# Patient Record
Sex: Female | Born: 1960 | Race: White | Hispanic: No | Marital: Single | Smoking: Former smoker
Health system: Southern US, Community
[De-identification: ages and names within clinical notes are randomized; demographics above are authoritative.]

## PROBLEM LIST (undated history)

## (undated) DIAGNOSIS — K219 Gastro-esophageal reflux disease without esophagitis: Secondary | ICD-10-CM

## (undated) DIAGNOSIS — I10 Essential (primary) hypertension: Secondary | ICD-10-CM

## (undated) DIAGNOSIS — M1990 Unspecified osteoarthritis, unspecified site: Secondary | ICD-10-CM

## (undated) DIAGNOSIS — E785 Hyperlipidemia, unspecified: Secondary | ICD-10-CM

## (undated) HISTORY — DX: Essential (primary) hypertension: I10

## (undated) HISTORY — DX: Gastro-esophageal reflux disease without esophagitis: K21.9

## (undated) HISTORY — PX: TOTAL HIP ARTHROPLASTY: SHX124

## (undated) HISTORY — PX: WISDOM TOOTH EXTRACTION: SHX21

## (undated) HISTORY — DX: Unspecified osteoarthritis, unspecified site: M19.90

## (undated) HISTORY — DX: Hyperlipidemia, unspecified: E78.5

---
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
## 1999-05-10 HISTORY — PX: CHOLECYSTECTOMY: SHX55

## 2003-01-01 ENCOUNTER — Other Ambulatory Visit: Admission: RE | Admit: 2003-01-01 | Discharge: 2003-01-01 | Payer: Self-pay | Admitting: Obstetrics and Gynecology

## 2004-02-03 ENCOUNTER — Encounter: Admission: RE | Admit: 2004-02-03 | Discharge: 2004-02-03 | Payer: Self-pay | Admitting: Obstetrics and Gynecology

## 2004-02-12 ENCOUNTER — Encounter: Admission: RE | Admit: 2004-02-12 | Discharge: 2004-02-12 | Payer: Self-pay | Admitting: Obstetrics and Gynecology

## 2010-05-30 ENCOUNTER — Encounter: Payer: Self-pay | Admitting: *Deleted

## 2011-07-06 ENCOUNTER — Ambulatory Visit (INDEPENDENT_AMBULATORY_CARE_PROVIDER_SITE_OTHER): Payer: 59 | Admitting: General Surgery

## 2011-07-18 ENCOUNTER — Ambulatory Visit (INDEPENDENT_AMBULATORY_CARE_PROVIDER_SITE_OTHER): Payer: 59 | Admitting: General Surgery

## 2011-07-18 ENCOUNTER — Encounter (INDEPENDENT_AMBULATORY_CARE_PROVIDER_SITE_OTHER): Payer: Self-pay | Admitting: General Surgery

## 2011-07-18 NOTE — Progress Notes (Signed)
Patient ID: Carolyn Morrison, female   DOB: [birth year], 51 y.o.   MRN: [id number]  Chief Complaint  Patient presents with  . Other    new bariatric- gastric sleeve    HPI Carolyn Morrison is a 51 y.o. female. This patient presents for evaluation for possible weight loss surgery. She has a BMI of 61 with comorbidities of arthritis, hypertension, and reflux. She struggled with her weight since she was age 79. She has tried multiple diets including Weight Watchers, Fen/Phen and weight loss clinic diet with the best results in the Fen/Phen diet. She lost 80 pounds in 6 months when on this. 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 She states that she'll have reflux even with water if she does not take her Nexium. HPI  Past Medical History  Diagnosis Date  . GERD (gastroesophageal reflux disease)   . Arthritis   . Hypertension   . Hyperlipidemia     Past Surgical History  Procedure Date  . Cesarean section 1992  . Cholecystectomy 2001  . Facial reconstruction surgery 1978    Family History  Problem Relation Age of Onset  . Diabetes Maternal Aunt     Social History History  Substance Use Topics  . Smoking status: Current Everyday Smoker -- 0.2 packs/day  . Smokeless tobacco: Not on file  . Alcohol Use: Yes    No Known Allergies  Current Outpatient Prescriptions  Medication Sig Dispense Refill  . aspirin 81 MG tablet Take 81 mg by mouth daily.      . CELEBREX 200 MG capsule daily.      . CYMBALTA 60 MG capsule daily.      . metaxalone (SKELAXIN) 800 MG tablet Ad lib.      Marland Kitchen MICARDIS HCT 80-12.5 MG per tablet daily.      Marland Kitchen NEXIUM 40 MG capsule daily.      . traMADol (ULTRAM) 50 MG tablet Ad lib.      Marland Kitchen ZOVIRAX 5 % Ad lib.        Review of Systems Review of Systems All other review of systems  negative or noncontributory except as stated in the HPI  Blood pressure 146/104, pulse 104, temperature 97.8 F (36.6 C), temperature source Temporal, resp. rate 24, height 5' 6.5" (1.689 m), weight 387 lb 3.2 oz (175.633 kg).  Physical Exam Physical Exam Physical Exam  Nursing note and vitals reviewed. Constitutional: She is oriented to person, place, and time. She appears well-developed and well-nourished. No distress.  HENT:  Head: Normocephalic and atraumatic.  Mouth/Throat: No oropharyngeal exudate.  Eyes: Conjunctivae and EOM are normal. Pupils are equal, round, and reactive to light. Right eye exhibits no discharge. Left eye exhibits no discharge. No scleral icterus.  Neck: Normal range of motion. Neck supple. No tracheal deviation present.  Cardiovascular: Normal rate, regular rhythm, normal heart sounds and intact distal pulses.   Pulmonary/Chest: Effort normal and breath sounds normal. No stridor. No respiratory distress. She has no wheezes.  Abdominal: Soft. Bowel sounds are normal. She exhibits no distension and no mass. There is no tenderness. There is no rebound and no guarding.  Musculoskeletal: Normal range of motion. She exhibits no edema and no tenderness.  Neurological: She is alert and oriented to person, place, and time.  Skin: Skin is warm  and dry. No rash noted. She is not diaphoretic. No erythema. No pallor.  Psychiatric: She has a normal mood and affect. Her behavior is normal. Judgment and thought content normal.    Data Reviewed   Assessment    Morbid obesity with a BMI of 61 and comorbidities of arthritis, GERD, and hypertension. I think she be a fine candidate for any procedures although given her severe reflux I think that  the. sleeve gastrectomy would not be advisable for her. She is not interested in the bed due to the maintenance and since this does not have as good a weight loss as the other procedures. We discussed the pros and cons of all the  procedures including the risks and benefits of each and she is most interested in the Roux-en-Y gastric bypass at this time. We discussed the risks of too much or too little weight loss, we regain, vitamin deficiencies, internal hernias, ulcers, and anastomotic leaks and she expressed understanding of the tube proceed with workup for weight loss surgery    Plan    We will start her on the surgery workup including nutrition and psychology evaluation. She will also need an echocardiogram given her history of Fen/Phen use. We will set her up with her mammograms and nutrition labs and we will see her back after her initial workup.  In the meantime, I have recommended continued weight loss.       Lodema Pilot DAVID 07/18/2011, 4:12 PM

## 2011-07-19 ENCOUNTER — Other Ambulatory Visit (INDEPENDENT_AMBULATORY_CARE_PROVIDER_SITE_OTHER): Payer: Self-pay

## 2011-07-19 DIAGNOSIS — Z1231 Encounter for screening mammogram for malignant neoplasm of breast: Secondary | ICD-10-CM

## 2011-07-19 LAB — CBC WITH DIFFERENTIAL/PLATELET
Basophils Absolute: 0 10*3/uL (ref 0.0–0.1)
Basophils Relative: 0 % (ref 0–1)
Eosinophils Absolute: 0.3 10*3/uL (ref 0.0–0.7)
Eosinophils Relative: 4 % (ref 0–5)
HCT: 42.2 % (ref 36.0–46.0)
Hemoglobin: 13.7 g/dL (ref 12.0–15.0)
Lymphocytes Relative: 28 % (ref 12–46)
Lymphs Abs: 2.7 10*3/uL (ref 0.7–4.0)
MCH: 29.5 pg (ref 26.0–34.0)
MCHC: 32.5 g/dL (ref 30.0–36.0)
MCV: 90.8 fL (ref 78.0–100.0)
Monocytes Absolute: 0.6 10*3/uL (ref 0.1–1.0)
Monocytes Relative: 6 % (ref 3–12)
Neutro Abs: 6.1 10*3/uL (ref 1.7–7.7)
Neutrophils Relative %: 62 % (ref 43–77)
Platelets: 279 10*3/uL (ref 150–400)
RBC: 4.65 MIL/uL (ref 3.87–5.11)
RDW: 14.9 % (ref 11.5–15.5)
WBC: 9.8 10*3/uL (ref 4.0–10.5)

## 2011-07-19 LAB — COMPREHENSIVE METABOLIC PANEL
ALT: 12 U/L (ref 0–35)
AST: 13 U/L (ref 0–37)
Albumin: 4 g/dL (ref 3.5–5.2)
Alkaline Phosphatase: 96 U/L (ref 39–117)
BUN: 13 mg/dL (ref 6–23)
CO2: 28 mEq/L (ref 19–32)
Calcium: 9.5 mg/dL (ref 8.4–10.5)
Chloride: 103 mEq/L (ref 96–112)
Creat: 0.66 mg/dL (ref 0.50–1.10)
Glucose, Bld: 89 mg/dL (ref 70–99)
Potassium: 4.2 mEq/L (ref 3.5–5.3)
Sodium: 139 mEq/L (ref 135–145)
Total Bilirubin: 0.4 mg/dL (ref 0.3–1.2)
Total Protein: 6.9 g/dL (ref 6.0–8.3)

## 2011-07-19 LAB — LIPID PANEL
Cholesterol: 212 mg/dL — ABNORMAL HIGH (ref 0–200)
HDL: 38 mg/dL — ABNORMAL LOW (ref >39)
LDL Cholesterol: 136 mg/dL — ABNORMAL HIGH (ref 0–99)
Total CHOL/HDL Ratio: 5.6 ratio
Triglycerides: 188 mg/dL — ABNORMAL HIGH (ref <150)
VLDL: 38 mg/dL (ref 0–40)

## 2011-07-19 LAB — IRON AND TIBC
%SAT: 17 % — ABNORMAL LOW (ref 20–55)
Iron: 58 ug/dL (ref 42–145)
TIBC: 332 ug/dL (ref 250–470)
UIBC: 274 ug/dL (ref 125–400)

## 2011-07-19 LAB — TSH: TSH: 1.286 u[IU]/mL (ref 0.350–4.500)

## 2011-07-19 LAB — T4: T4, Total: 8.7 ug/dL (ref 5.0–12.5)

## 2011-07-19 LAB — H. PYLORI ANTIBODY, IGG: H Pylori IgG: <0.4 {ISR}

## 2011-07-26 ENCOUNTER — Ambulatory Visit (HOSPITAL_COMMUNITY)
Admission: RE | Admit: 2011-07-26 | Discharge: 2011-07-26 | Disposition: A | Discharge status: Home / Self Care | Payer: 59 | Source: Ambulatory Visit | Attending: General Surgery | Admitting: General Surgery

## 2011-07-26 ENCOUNTER — Other Ambulatory Visit (INDEPENDENT_AMBULATORY_CARE_PROVIDER_SITE_OTHER): Payer: Self-pay | Admitting: General Surgery

## 2011-07-26 ENCOUNTER — Other Ambulatory Visit (HOSPITAL_COMMUNITY): Payer: Self-pay

## 2011-07-26 DIAGNOSIS — Z01818 Encounter for other preprocedural examination: Secondary | ICD-10-CM | POA: Insufficient documentation

## 2011-08-09 ENCOUNTER — Encounter: Payer: 59 | Attending: General Surgery | Admitting: *Deleted

## 2011-08-09 ENCOUNTER — Ambulatory Visit (HOSPITAL_COMMUNITY)
Admission: RE | Admit: 2011-08-09 | Discharge: 2011-08-09 | Payer: 59 | Source: Ambulatory Visit | Attending: General Surgery | Admitting: General Surgery

## 2011-08-09 ENCOUNTER — Encounter: Payer: Self-pay | Admitting: *Deleted

## 2011-08-09 DIAGNOSIS — Z713 Dietary counseling and surveillance: Secondary | ICD-10-CM | POA: Insufficient documentation

## 2011-08-09 DIAGNOSIS — Z01818 Encounter for other preprocedural examination: Secondary | ICD-10-CM | POA: Insufficient documentation

## 2011-08-09 NOTE — Patient Instructions (Signed)
   Follow Pre-Op Nutrition Goals to prepare for Gastric Bypass Surgery.   Call the [facility] at [phone] once you have been given your surgery date to enrolled in the Pre-Op Nutrition Class. You will need to attend this nutrition class 3-4 weeks prior to your surgery. 

## 2011-08-09 NOTE — Progress Notes (Addendum)
  Pre-Op Assessment Visit:  Pre-Operative Gastric Bypass Surgery  Medical Nutrition Therapy:  Appt start time: 1200 end time:  1300.  Patient was seen on 08/09/2011 for Pre-Operative Gastric Bypass Nutrition Assessment. Assessment and letter of approval faxed to [facility] Bariat[facility] Program coordinator on 08/09/2011.  Approval letter sent to [facility] and will be available in the chart under the media tab.  *Pt did not want to do Tanita measurements this visit.   Handouts given during visit include:  Pre-Op Goals Handout  Bariatric Protein Shakes handout  Bariatric Support Group dates  B.E.L.T. Program Flyer  Patient to call for Pre-Op and Post-Op Nutrition Education at the [facility] when surgery is scheduled.

## 2011-09-15 ENCOUNTER — Ambulatory Visit: Payer: 59

## 2011-09-21 ENCOUNTER — Other Ambulatory Visit (INDEPENDENT_AMBULATORY_CARE_PROVIDER_SITE_OTHER): Payer: Self-pay | Admitting: General Surgery

## 2011-09-22 ENCOUNTER — Other Ambulatory Visit (INDEPENDENT_AMBULATORY_CARE_PROVIDER_SITE_OTHER): Payer: Self-pay

## 2011-09-22 DIAGNOSIS — Z01812 Encounter for preprocedural laboratory examination: Secondary | ICD-10-CM

## 2011-09-22 DIAGNOSIS — Z720 Tobacco use: Secondary | ICD-10-CM

## 2011-10-07 ENCOUNTER — Other Ambulatory Visit (INDEPENDENT_AMBULATORY_CARE_PROVIDER_SITE_OTHER): Payer: Self-pay | Admitting: General Surgery

## 2011-10-07 ENCOUNTER — Ambulatory Visit
Admission: RE | Admit: 2011-10-07 | Discharge: 2011-10-07 | Disposition: A | Discharge status: Home / Self Care | Payer: 59 | Source: Ambulatory Visit | Attending: General Surgery | Admitting: General Surgery

## 2011-10-07 DIAGNOSIS — Z1231 Encounter for screening mammogram for malignant neoplasm of breast: Secondary | ICD-10-CM

## 2011-10-10 LAB — NICOTINE/COTININE METABOLITES: Cotinine: 70 ng/mL

## 2011-10-20 ENCOUNTER — Telehealth (INDEPENDENT_AMBULATORY_CARE_PROVIDER_SITE_OTHER): Payer: Self-pay

## 2011-10-20 ENCOUNTER — Other Ambulatory Visit (INDEPENDENT_AMBULATORY_CARE_PROVIDER_SITE_OTHER): Payer: Self-pay

## 2011-10-20 DIAGNOSIS — Z01812 Encounter for preprocedural laboratory examination: Secondary | ICD-10-CM

## 2011-10-20 NOTE — Telephone Encounter (Signed)
Paged Dr. Biagio Quint regarding patient Pre-Op Nicotine Test results of 70, range for negative results are below 25.  Per Dr. Biagio Quint Carolyn Morrison must have negative nicotine test reults before Gastric By-Pass/RNY surgery will be scheduled.  Re-Test in 30 day's (11/07/2011)  Orders will be sent to the patient.

## 2011-11-09 ENCOUNTER — Other Ambulatory Visit (INDEPENDENT_AMBULATORY_CARE_PROVIDER_SITE_OTHER): Payer: Self-pay | Admitting: General Surgery

## 2011-11-10 LAB — NICOTINE/COTININE METABOLITES: Cotinine: <10 ng/mL

## 2011-11-23 ENCOUNTER — Other Ambulatory Visit (INDEPENDENT_AMBULATORY_CARE_PROVIDER_SITE_OTHER): Payer: Self-pay | Admitting: General Surgery

## 2011-11-23 ENCOUNTER — Telehealth (INDEPENDENT_AMBULATORY_CARE_PROVIDER_SITE_OTHER): Payer: Self-pay

## 2011-11-23 NOTE — Telephone Encounter (Signed)
Spoke with patient, Dr. Biagio Quint has patient's chart plans to review all pre-op bariatric test results including the nicotine test and we will then schedule her for surgery.

## 2011-12-08 ENCOUNTER — Encounter: Payer: 59 | Attending: General Surgery | Admitting: *Deleted

## 2011-12-08 ENCOUNTER — Encounter: Payer: Self-pay | Admitting: *Deleted

## 2011-12-08 DIAGNOSIS — Z713 Dietary counseling and surveillance: Secondary | ICD-10-CM | POA: Insufficient documentation

## 2011-12-08 DIAGNOSIS — Z01818 Encounter for other preprocedural examination: Secondary | ICD-10-CM | POA: Insufficient documentation

## 2011-12-08 NOTE — Progress Notes (Addendum)
  Bariatric Class:  Appt start time: 1730 end time:  1830.  Pre-Operative Nutrition Class  Patient was seen on 12/08/2011 for Pre-Operative Bariatric Surgery Education at the [facility].   Surgery date: 01/03/12 Surgery type: RYGB Start weight at [facility]: 382.5 lbs  Weight today: 392.1 lbs Weight change: n/a Total weight lost: n/a BMI: 63.3 kg/m^2  Samples given per MNT protocol: Bariatric Advantage Multivitamin Lot # [id number] Exp: 12/13  Bariatric Advantage Calcium Citrate Lot # [id number] Exp: 12/13  Celebrate Vitamins Multivitamin Lot # [id number] Exp: 11/14  Celebrate Vitamins Calcium Citrate Lot # [id number] Exp: 10/14  Opurity Vitamins MVI (Sleeve/Bypass Optimized) Lot # [id number] Exp: 11/14  Twin[employer] Calcium Wafers Lot # [id number] Exp: 10/14  The following the learning objective met by the patient during this course:   Identifies Pre-Op Dietary Goals and will begin 2 weeks pre-operatively  Identifies appropriate sources of fluids and proteins   States protein recommendations and appropriate sources pre and post-operatively  Identifies Post-Operative Dietary Goals and will follow for 2 weeks post-operatively  Identifies appropriate multivitamin and calcium sources  Describes the need for physical activity post-operatively and will follow MD recommendations  States when to call healthcare provider regarding medication questions or post-operative complications  Handouts given during class include:  Pre-Op Bariatric Surgery Diet Handout  Protein Shake Handout  Post-Op Bariatric Surgery Nutrition Handout  BELT Program Information Flyer  Support Group Information Flyer  Follow-Up Plan: Patient will follow-up at [facility] 2 weeks post operatively for diet advancement per MD.

## 2011-12-08 NOTE — Patient Instructions (Signed)
Follow:   Pre-Op Diet per MD 2 weeks prior to surgery  Phase 2- Liquids (clear/full) 2 weeks after surgery  Vitamin/Mineral/Calcium guidelines for purchasing bariatric supplements  Exercise guidelines pre and post-op per MD  Follow-up at [facility] in 2 weeks post-op for diet advancement. Contact Qunisha Bryk as needed with questions/concerns. 

## 2011-12-28 ENCOUNTER — Ambulatory Visit (INDEPENDENT_AMBULATORY_CARE_PROVIDER_SITE_OTHER): Payer: 59 | Admitting: General Surgery

## 2011-12-28 ENCOUNTER — Encounter (HOSPITAL_COMMUNITY): Payer: Self-pay | Admitting: Pharmacy Technician

## 2011-12-28 ENCOUNTER — Other Ambulatory Visit (INDEPENDENT_AMBULATORY_CARE_PROVIDER_SITE_OTHER): Payer: Self-pay | Admitting: General Surgery

## 2011-12-28 ENCOUNTER — Encounter (INDEPENDENT_AMBULATORY_CARE_PROVIDER_SITE_OTHER): Payer: Self-pay | Admitting: General Surgery

## 2011-12-28 VITALS — BP 140/70 | HR 103 | Temp 97.6°F | Ht 66.5 in | Wt 382.0 lb

## 2011-12-28 DIAGNOSIS — Z6841 Body Mass Index (BMI) 40.0 and over, adult: Secondary | ICD-10-CM

## 2011-12-28 LAB — COMPREHENSIVE METABOLIC PANEL
AST: 15 U/L (ref 0–37)
Albumin: 4 g/dL (ref 3.5–5.2)
Alkaline Phosphatase: 83 U/L (ref 39–117)
BUN: 19 mg/dL (ref 6–23)
Potassium: 4 mEq/L (ref 3.5–5.3)
Total Bilirubin: 0.5 mg/dL (ref 0.3–1.2)

## 2011-12-28 LAB — CBC WITH DIFFERENTIAL/PLATELET
Basophils Absolute: 0 10*3/uL (ref 0.0–0.1)
Basophils Relative: 0 % (ref 0–1)
Eosinophils Absolute: 0.3 10*3/uL (ref 0.0–0.7)
HCT: 38.5 % (ref 36.0–46.0)
MCHC: 34 g/dL (ref 30.0–36.0)
Monocytes Absolute: 0.6 10*3/uL (ref 0.1–1.0)
Neutro Abs: 5.6 10*3/uL (ref 1.7–7.7)
Neutrophils Relative %: 63 % (ref 43–77)
RDW: 14.4 % (ref 11.5–15.5)

## 2011-12-28 NOTE — Progress Notes (Signed)
Patient ID: Carolyn Morrison, female   DOB: 1960/09/04, 51 y.o.   MRN: [id number]  Chief Complaint  Patient presents with  . Bariatric Pre-op    RNY    HPI Carolyn Morrison is a 51 y.o. female.  HPI This patient presents for her preoperative evaluation for a laparoscopic Roux-en-Y gastric bypass next week. She's BMI 61 with comorbidities of arthritis, gastroesophageal reflux, hypertension. Since we last minute she has had her upper GI and laboratory studies and seen by nutritionist and psychologist. She has stopped smoking for the last 3 months. She denies any other significant changes since her last visit.  Past Medical History  Diagnosis Date  . GERD (gastroesophageal reflux disease)   . Arthritis   . Hypertension   . Hyperlipidemia     Past Surgical History  Procedure Date  . Cesarean section 1992  . Cholecystectomy 2001  . Facial reconstruction surgery 1978  . Appendectomy 1976    Family History  Problem Relation Age of Onset  . Diabetes Maternal Aunt     Social History History  Substance Use Topics  . Smoking status: Current Everyday Smoker -- 0.2 packs/day  . Smokeless tobacco: Not on file  . Alcohol Use: Yes    No Known Allergies  Current Outpatient Prescriptions  Medication Sig Dispense Refill  . aspirin 81 MG tablet Take 81 mg by mouth daily.      . B Complex Vitamins (VITAMIN-B COMPLEX PO) Take by mouth.      . CELEBREX 200 MG capsule Take 200 mg by mouth every morning.       . Cinnamon 500 MG capsule Take 500 mg by mouth daily.      . CYMBALTA 60 MG capsule Take 60 mg by mouth every morning.       . fish oil-omega-3 fatty acids 1000 MG capsule Take 2 g by mouth daily.      Marland Kitchen glucosamine-chondroitin 500-400 MG tablet Take 1 tablet by mouth 3 (three) times daily.      . magnesium oxide (MAG-OX) 400 MG tablet Take 400 mg by mouth daily.      . metaxalone (SKELAXIN) 800 MG tablet Take 800 mg by mouth 4 (four) times daily.       Marland Kitchen MICARDIS HCT 80-12.5 MG per  tablet Take 1 tablet by mouth every morning.       . Multiple Vitamin (MULTIVITAMIN WITH MINERALS) TABS Take 1 tablet by mouth daily.      Marland Kitchen NEXIUM 40 MG capsule Take 40 mg by mouth every morning.       . traMADol (ULTRAM) 50 MG tablet Take 25 mg by mouth every 6 (six) hours as needed. For pain      . ZOVIRAX 5 % Apply 1 application topically as needed. Apply to affected areas for break outs        Review of Systems Review of Systems All other review of systems negative or noncontributory except as stated in the HPI  Blood pressure 140/70, pulse 103, temperature 97.6 F (36.4 C), temperature source Temporal, height 5' 6.5" (1.689 m), weight 382 lb (173.274 kg), SpO2 98.00%.  Physical Exam Physical Exam Physical Exam  Nursing note and vitals reviewed. Constitutional: She is oriented to person, place, and time. She appears well-developed and well-nourished. No distress.  HENT:  Head: Normocephalic and atraumatic.  Mouth/Throat: No oropharyngeal exudate.  Eyes: Conjunctivae and EOM are normal. Pupils are equal, round, and reactive to light. Right eye exhibits no discharge. Left  eye exhibits no discharge. No scleral icterus.  Neck: Normal range of motion. Neck supple. No tracheal deviation present.  Cardiovascular: Normal rate, regular rhythm, normal heart sounds and intact distal pulses.   Pulmonary/Chest: Effort normal and breath sounds normal. No stridor. No respiratory distress. She has no wheezes.  Abdominal: Soft. Bowel sounds are normal. She exhibits no distension and no mass. There is no tenderness. There is no rebound and no guarding. WHSS Musculoskeletal: Normal range of motion. She exhibits no edema and no tenderness. Walking with cane Neurological: She is alert and oriented to person, place, and time.  Skin: Skin is warm and dry. No rash noted. She is not diaphoretic. No erythema. No pallor.  Psychiatric: She has a normal mood and affect. Her behavior is normal. Judgment and  thought content normal.      Data Reviewed   Assessment    Morbid obesity with a BMI of 61 and comorbidities of hypertension, arthritis, gastroesophageal reflux Think that she is a fine candidate for Roux-en-Y gastric bypass and she should benefit from this. Currently her lifestyle is limited from the arthritis and a think the weight loss will substantially improve this. We would hope to improve her reflux, her arthritis, and hopefully her hypertension is well. We are planning for surgery next week. She has stopped smoking for the last 3 months. We will repeat nicotine test again today with her preop labs.  The risks of infection, bleeding, pain, scarring, weight regain, too little or too much weight loss, vitamin deficiencies and need for lifelong vitamin supplementation, hair loss, need for protein supplementation, leaks, stricture, reflux, food intolerance, need for reoperation , need for open surgery, injury to spleen or surrounding structures, DVT's, PE, and death again discussed with the patient and the patient expressed understanding and desires to proceed with laparoscopic RYGB, possible open, intraoperative endoscopy. I explained that due to her prior procedures, she may be higher risk for open conversion.     Plan    We will plan for RYGB next week.       Lodema Pilot DAVID 12/28/2011, 12:03 PM

## 2011-12-30 NOTE — Patient Instructions (Signed)
20 Carolyn Morrison  12/30/2011   Your procedure is scheduled on:  01/03/12 [id number]  Report to [facility] at 0515 AM.  Call this number if you have problems the morning of surgery: [phone]   Remember:   Do not eat food:After Midnight.  May have clear liquids:until Midnight .    :   Do not wear jewelry, make-up or nail polish.  Do not wear lotions, powders, or perfumes. .  Do not shave 48 hours prior to surgery.   Do not bring valuables to the hospital.  Contacts, dentures or bridgework may not be worn into surgery.  Leave suitcase in the car. After surgery it may be brought to your room.  For patients admitted to the hospital, checkout time is 11:00 AM the day of discharge.     Special Instructions: CHG Shower Use Special Wash: 1/2 bottle night before surgery and 1/2 bottle morning of surgery. shower chin to toes with CHG. Wash face and private parts with regular soap.    Please read over the following fact sheets that you were given: MRSA Information, coughing and deep breathing exercises, leg exercises

## 2012-01-02 ENCOUNTER — Encounter (HOSPITAL_COMMUNITY): Payer: Self-pay

## 2012-01-02 ENCOUNTER — Encounter (HOSPITAL_COMMUNITY)
Admission: RE | Admit: 2012-01-02 | Discharge: 2012-01-02 | Disposition: A | Discharge status: Home / Self Care | Payer: 59 | Source: Ambulatory Visit | Attending: General Surgery | Admitting: General Surgery

## 2012-01-02 ENCOUNTER — Ambulatory Visit (HOSPITAL_COMMUNITY)
Admission: RE | Admit: 2012-01-02 | Discharge: 2012-01-02 | Disposition: A | Discharge status: Home / Self Care | Payer: 59 | Source: Ambulatory Visit | Attending: General Surgery | Admitting: General Surgery

## 2012-01-02 DIAGNOSIS — R0602 Shortness of breath: Secondary | ICD-10-CM | POA: Insufficient documentation

## 2012-01-02 DIAGNOSIS — Z01810 Encounter for preprocedural cardiovascular examination: Secondary | ICD-10-CM | POA: Insufficient documentation

## 2012-01-02 DIAGNOSIS — I10 Essential (primary) hypertension: Secondary | ICD-10-CM | POA: Insufficient documentation

## 2012-01-02 DIAGNOSIS — Z01812 Encounter for preprocedural laboratory examination: Secondary | ICD-10-CM | POA: Insufficient documentation

## 2012-01-02 NOTE — Progress Notes (Signed)
Patient was unsure of bowel prep for today or if there was any.  Patient stated she had no received no instructions from office.  Called office and spoke with Britta Mccreedy.  No special prep per Britta Mccreedy at office per Dr Biagio Quint.  Britta Mccreedy stated patient was better off if did clear liquid diet today.  Patient provided with clear liquid diet sheet and reviewed with patient.  Patient voiced understanding.

## 2012-01-02 NOTE — Progress Notes (Signed)
CBC, DIFF, CMP , and Urine Cotinine done 12/28/11 Results in EPIC.

## 2012-01-03 ENCOUNTER — Encounter (HOSPITAL_COMMUNITY)
Admission: RE | Disposition: A | Discharge status: Home / Self Care | Payer: Self-pay | Source: Ambulatory Visit | Attending: General Surgery

## 2012-01-03 ENCOUNTER — Inpatient Hospital Stay (HOSPITAL_COMMUNITY): Payer: 59 | Admitting: Anesthesiology

## 2012-01-03 ENCOUNTER — Encounter (HOSPITAL_COMMUNITY): Payer: Self-pay | Admitting: Anesthesiology

## 2012-01-03 ENCOUNTER — Inpatient Hospital Stay (HOSPITAL_COMMUNITY)
Admission: RE | Admit: 2012-01-03 | Discharge: 2012-01-05 | DRG: 620 | Disposition: A | Discharge status: Home / Self Care | Payer: 59 | Source: Ambulatory Visit | Attending: General Surgery | Admitting: General Surgery

## 2012-01-03 ENCOUNTER — Encounter (HOSPITAL_COMMUNITY): Payer: Self-pay | Admitting: *Deleted

## 2012-01-03 DIAGNOSIS — K420 Umbilical hernia with obstruction, without gangrene: Secondary | ICD-10-CM | POA: Diagnosis present

## 2012-01-03 DIAGNOSIS — K219 Gastro-esophageal reflux disease without esophagitis: Secondary | ICD-10-CM | POA: Diagnosis present

## 2012-01-03 DIAGNOSIS — Z01812 Encounter for preprocedural laboratory examination: Secondary | ICD-10-CM

## 2012-01-03 DIAGNOSIS — Z6841 Body Mass Index (BMI) 40.0 and over, adult: Secondary | ICD-10-CM

## 2012-01-03 DIAGNOSIS — I10 Essential (primary) hypertension: Secondary | ICD-10-CM | POA: Diagnosis present

## 2012-01-03 DIAGNOSIS — K210 Gastro-esophageal reflux disease with esophagitis: Secondary | ICD-10-CM

## 2012-01-03 DIAGNOSIS — M129 Arthropathy, unspecified: Secondary | ICD-10-CM | POA: Diagnosis present

## 2012-01-03 HISTORY — PX: GASTRIC ROUX-EN-Y: SHX5262

## 2012-01-03 SURGERY — LAPAROSCOPIC ROUX-EN-Y GASTRIC BYPASS WITH UPPER ENDOSCOPY
Anesthesia: General | Site: Throat | Wound class: Clean Contaminated

## 2012-01-03 MED ORDER — BUPIVACAINE HCL (PF) 0.25 % IJ SOLN
INTRAMUSCULAR | Status: AC
  Filled 2012-01-03: qty 30

## 2012-01-03 MED ORDER — OXYCODONE HCL 5 MG/5ML PO SOLN
5.0000 mg | Freq: Once | ORAL | Status: DC | PRN
  Filled 2012-01-03: qty 5

## 2012-01-03 MED ORDER — ROCURONIUM BROMIDE 100 MG/10ML IV SOLN
INTRAVENOUS | Status: DC | PRN
  Administered 2012-01-03: 50 mg via INTRAVENOUS
  Administered 2012-01-03 (×2): 20 mg via INTRAVENOUS
  Administered 2012-01-03: 30 mg via INTRAVENOUS
  Administered 2012-01-03: 20 mg via INTRAVENOUS
  Administered 2012-01-03: 10 mg via INTRAVENOUS
  Administered 2012-01-03: 20 mg via INTRAVENOUS

## 2012-01-03 MED ORDER — BIOTENE DRY MOUTH MT LIQD
15.0000 mL | Freq: Two times a day (BID) | OROMUCOSAL | Status: DC
  Administered 2012-01-03 – 2012-01-05 (×4): 15 mL via OROMUCOSAL

## 2012-01-03 MED ORDER — ACETAMINOPHEN 10 MG/ML IV SOLN: 1000.0000 mg | Freq: Once | INTRAVENOUS | Status: DC | PRN

## 2012-01-03 MED ORDER — HEPARIN SODIUM (PORCINE) 5000 UNIT/ML IJ SOLN
5000.0000 [IU] | Freq: Once | INTRAMUSCULAR | Status: AC
  Administered 2012-01-03: 5000 [IU] via SUBCUTANEOUS
  Filled 2012-01-03: qty 1

## 2012-01-03 MED ORDER — MIDAZOLAM HCL 5 MG/5ML IJ SOLN
INTRAMUSCULAR | Status: DC | PRN
Start: 2012-01-03 — End: 2012-01-03
  Administered 2012-01-03: 2 mg via INTRAVENOUS

## 2012-01-03 MED ORDER — 0.9 % SODIUM CHLORIDE (POUR BTL) OPTIME
TOPICAL | Status: DC | PRN
  Administered 2012-01-03: 1000 mL

## 2012-01-03 MED ORDER — IRBESARTAN 300 MG PO TABS
300.0000 mg | ORAL_TABLET | Freq: Every day | ORAL | Status: DC
  Administered 2012-01-04 – 2012-01-05 (×2): 300 mg via ORAL
  Filled 2012-01-03 (×3): qty 1

## 2012-01-03 MED ORDER — UNJURY VANILLA POWDER
2.0000 [oz_av] | Freq: Four times a day (QID) | ORAL | Status: DC
  Administered 2012-01-05: 2 [oz_av] via ORAL

## 2012-01-03 MED ORDER — HYDROMORPHONE HCL PF 1 MG/ML IJ SOLN
INTRAMUSCULAR | Status: AC
  Filled 2012-01-03: qty 1

## 2012-01-03 MED ORDER — TELMISARTAN-HCTZ 80-12.5 MG PO TABS: 1.0000 | ORAL_TABLET | Freq: Every morning | ORAL | Status: DC

## 2012-01-03 MED ORDER — MORPHINE SULFATE 2 MG/ML IJ SOLN
2.0000 mg | INTRAMUSCULAR | Status: DC | PRN
  Administered 2012-01-03 – 2012-01-04 (×8): 2 mg via INTRAVENOUS
  Filled 2012-01-03 (×8): qty 1

## 2012-01-03 MED ORDER — PROPOFOL 10 MG/ML IV BOLUS
INTRAVENOUS | Status: DC | PRN
  Administered 2012-01-03: 200 mg via INTRAVENOUS

## 2012-01-03 MED ORDER — TISSEEL VH 10 ML EX KIT
PACK | CUTANEOUS | Status: AC
  Filled 2012-01-03: qty 1

## 2012-01-03 MED ORDER — HYDROCHLOROTHIAZIDE 12.5 MG PO CAPS
12.5000 mg | ORAL_CAPSULE | Freq: Every day | ORAL | Status: DC
  Administered 2012-01-04 – 2012-01-05 (×2): 12.5 mg via ORAL
  Filled 2012-01-03 (×3): qty 1

## 2012-01-03 MED ORDER — SODIUM CHLORIDE 0.9 % IV SOLN
INTRAVENOUS | Status: AC
  Filled 2012-01-03: qty 1

## 2012-01-03 MED ORDER — UNJURY CHICKEN SOUP POWDER: 2.0000 [oz_av] | Freq: Four times a day (QID) | ORAL | Status: DC

## 2012-01-03 MED ORDER — ACETAMINOPHEN 10 MG/ML IV SOLN
INTRAVENOUS | Status: DC | PRN
  Administered 2012-01-03: 1000 mg via INTRAVENOUS

## 2012-01-03 MED ORDER — KETAMINE HCL 10 MG/ML IJ SOLN
INTRAMUSCULAR | Status: DC | PRN
  Administered 2012-01-03: 1 mg via INTRAVENOUS
  Administered 2012-01-03 (×12): 2 mg via INTRAVENOUS
  Administered 2012-01-03: 1 mg via INTRAVENOUS
  Administered 2012-01-03: 2 mg via INTRAVENOUS
  Administered 2012-01-03: 25 mg via INTRAVENOUS

## 2012-01-03 MED ORDER — LACTATED RINGERS IR SOLN
Status: DC | PRN
  Administered 2012-01-03: 3000 mL

## 2012-01-03 MED ORDER — ONDANSETRON HCL 4 MG/2ML IJ SOLN: 4.0000 mg | INTRAMUSCULAR | Status: DC | PRN

## 2012-01-03 MED ORDER — SODIUM CHLORIDE 0.9 % IV SOLN
1.0000 g | INTRAVENOUS | Status: AC
  Administered 2012-01-03: 1 g via INTRAVENOUS
  Filled 2012-01-03: qty 1

## 2012-01-03 MED ORDER — SUCCINYLCHOLINE CHLORIDE 20 MG/ML IJ SOLN
INTRAMUSCULAR | Status: DC | PRN
  Administered 2012-01-03: 100 mg via INTRAVENOUS

## 2012-01-03 MED ORDER — KCL IN DEXTROSE-NACL 20-5-0.45 MEQ/L-%-% IV SOLN
INTRAVENOUS | Status: DC
  Administered 2012-01-03: 16:00:00 via INTRAVENOUS
  Administered 2012-01-04: 125 mL/h via INTRAVENOUS
  Administered 2012-01-05: 11:00:00 via INTRAVENOUS
  Filled 2012-01-03 (×9): qty 1000

## 2012-01-03 MED ORDER — UNJURY CHOCOLATE CLASSIC POWDER: 2.0000 [oz_av] | Freq: Four times a day (QID) | ORAL | Status: DC

## 2012-01-03 MED ORDER — BUPIVACAINE HCL 0.25 % IJ SOLN
INTRAMUSCULAR | Status: DC | PRN
  Administered 2012-01-03: 30 mL

## 2012-01-03 MED ORDER — ONDANSETRON HCL 4 MG/2ML IJ SOLN
INTRAMUSCULAR | Status: DC | PRN
  Administered 2012-01-03: 4 mg via INTRAVENOUS

## 2012-01-03 MED ORDER — LIDOCAINE-EPINEPHRINE 1 %-1:100000 IJ SOLN
INTRAMUSCULAR | Status: DC | PRN
  Administered 2012-01-03: 30 mL

## 2012-01-03 MED ORDER — FENTANYL CITRATE 0.05 MG/ML IJ SOLN
INTRAMUSCULAR | Status: DC | PRN
  Administered 2012-01-03: 25 ug via INTRAVENOUS
  Administered 2012-01-03 (×4): 50 ug via INTRAVENOUS
  Administered 2012-01-03: 25 ug via INTRAVENOUS

## 2012-01-03 MED ORDER — LACTATED RINGERS IV SOLN
INTRAVENOUS | Status: DC | PRN
  Administered 2012-01-03 (×4): via INTRAVENOUS

## 2012-01-03 MED ORDER — KETOROLAC TROMETHAMINE 30 MG/ML IJ SOLN
30.0000 mg | Freq: Once | INTRAMUSCULAR | Status: AC
  Administered 2012-01-03: 30 mg via INTRAVENOUS
  Filled 2012-01-03: qty 1

## 2012-01-03 MED ORDER — ENOXAPARIN SODIUM 40 MG/0.4ML ~~LOC~~ SOLN
40.0000 mg | Freq: Every day | SUBCUTANEOUS | Status: DC
  Administered 2012-01-04 – 2012-01-05 (×2): 40 mg via SUBCUTANEOUS
  Filled 2012-01-03 (×2): qty 0.4

## 2012-01-03 MED ORDER — NEOSTIGMINE METHYLSULFATE 1 MG/ML IJ SOLN
INTRAMUSCULAR | Status: DC | PRN
  Administered 2012-01-03: 5 mg via INTRAVENOUS

## 2012-01-03 MED ORDER — MEPERIDINE HCL 50 MG/ML IJ SOLN: 6.2500 mg | INTRAMUSCULAR | Status: DC | PRN

## 2012-01-03 MED ORDER — ACETAMINOPHEN 10 MG/ML IV SOLN
INTRAVENOUS | Status: AC
  Filled 2012-01-03: qty 100

## 2012-01-03 MED ORDER — GLYCOPYRROLATE 0.2 MG/ML IJ SOLN
INTRAMUSCULAR | Status: DC | PRN
  Administered 2012-01-03: 0.6 mg via INTRAVENOUS

## 2012-01-03 MED ORDER — HYDROMORPHONE HCL PF 1 MG/ML IJ SOLN
0.2500 mg | INTRAMUSCULAR | Status: DC | PRN
  Administered 2012-01-03 (×2): 0.5 mg via INTRAVENOUS

## 2012-01-03 MED ORDER — OXYCODONE-ACETAMINOPHEN 5-325 MG/5ML PO SOLN
5.0000 mL | ORAL | Status: DC | PRN
  Administered 2012-01-05 (×2): 10 mL via ORAL
  Filled 2012-01-03 (×2): qty 10

## 2012-01-03 MED ORDER — LIDOCAINE-EPINEPHRINE 1 %-1:100000 IJ SOLN
INTRAMUSCULAR | Status: AC
  Filled 2012-01-03: qty 1

## 2012-01-03 MED ORDER — OXYCODONE HCL 5 MG PO TABS: 5.0000 mg | ORAL_TABLET | Freq: Once | ORAL | Status: DC | PRN

## 2012-01-03 MED ORDER — METOCLOPRAMIDE HCL 5 MG/ML IJ SOLN
INTRAMUSCULAR | Status: DC | PRN
  Administered 2012-01-03: 10 mg via INTRAVENOUS

## 2012-01-03 MED ORDER — PROMETHAZINE HCL 25 MG/ML IJ SOLN: 6.2500 mg | INTRAMUSCULAR | Status: DC | PRN

## 2012-01-03 MED ORDER — DEXAMETHASONE SODIUM PHOSPHATE 4 MG/ML IJ SOLN
INTRAMUSCULAR | Status: DC | PRN
  Administered 2012-01-03: 10 mg via INTRAVENOUS

## 2012-01-03 SURGICAL SUPPLY — 65 items
APPLIER CLIP ROT 13.4 12 LRG (CLIP)
APPLIER CLIP UNV 5X34 EPIX (ENDOMECHANICALS) IMPLANT
BLADE SURG 15 STRL LF DISP TIS (BLADE) ×2 IMPLANT
BLADE SURG 15 STRL SS (BLADE) ×1
CABLE HIGH FREQUENCY MONO STRZ (ELECTRODE) ×3 IMPLANT
CANISTER SUCTION 2500CC (MISCELLANEOUS) ×3 IMPLANT
CHLORAPREP W/TINT 26ML (MISCELLANEOUS) ×6 IMPLANT
CLIP APPLIE ROT 13.4 12 LRG (CLIP) IMPLANT
CLIP SUT LAPRA TY ABSORB (SUTURE) IMPLANT
CLOTH BEACON ORANGE TIMEOUT ST (SAFETY) ×3 IMPLANT
COVER PROBE U/S 5X48 (MISCELLANEOUS) ×3 IMPLANT
COVER SURGICAL LIGHT HANDLE (MISCELLANEOUS) ×3 IMPLANT
DEVICE SUTURE ENDOST 10MM (ENDOMECHANICALS) ×3 IMPLANT
DISSECTOR BLUNT TIP ENDO 5MM (MISCELLANEOUS) IMPLANT
DRAIN CHANNEL 19F RND (DRAIN) ×3 IMPLANT
DRAPE CAMERA CLOSED 9X96 (DRAPES) ×3 IMPLANT
EVACUATOR DRAINAGE 10X20 100CC (DRAIN) ×2 IMPLANT
EVACUATOR SILICONE 100CC (DRAIN) ×1
GAUZE SPONGE 4X4 16PLY XRAY LF (GAUZE/BANDAGES/DRESSINGS) ×3 IMPLANT
GLOVE SURG SS PI 7.5 STRL IVOR (GLOVE) ×6 IMPLANT
GOWN STRL NON-REIN LRG LVL3 (GOWN DISPOSABLE) IMPLANT
GOWN STRL REIN XL XLG (GOWN DISPOSABLE) ×12 IMPLANT
HOVERMATT SINGLE USE (MISCELLANEOUS) ×3 IMPLANT
KIT BASIN OR (CUSTOM PROCEDURE TRAY) ×3 IMPLANT
KIT GASTRIC LAVAGE 34FR ADT (SET/KITS/TRAYS/PACK) ×3 IMPLANT
NEEDLE SPNL 22GX3.5 QUINCKE BK (NEEDLE) ×3 IMPLANT
NS IRRIG 1000ML POUR BTL (IV SOLUTION) ×3 IMPLANT
PACK CARDIOVASCULAR III (CUSTOM PROCEDURE TRAY) ×3 IMPLANT
PEN SKIN MARKING BROAD (MISCELLANEOUS) ×3 IMPLANT
PENCIL BUTTON HOLSTER BLD 10FT (ELECTRODE) ×3 IMPLANT
RELOAD BLUE (STAPLE) ×6 IMPLANT
RELOAD ENDO STITCH (ENDOMECHANICALS) ×18 IMPLANT
RELOAD ENDO STITCH 2.0 (ENDOMECHANICALS)
RELOAD GOLD (STAPLE) ×12 IMPLANT
RELOAD WHITE ECR60W (STAPLE) ×9 IMPLANT
SCISSORS LAP 5X45 EPIX DISP (ENDOMECHANICALS) ×6 IMPLANT
SEALANT SURGICAL APPL DUAL CAN (MISCELLANEOUS) IMPLANT
SET IRRIG TUBING LAPAROSCOPIC (IRRIGATION / IRRIGATOR) ×3 IMPLANT
SHEARS CURVED HARMONIC AC 45CM (MISCELLANEOUS) ×3 IMPLANT
SOLUTION ANTI FOG 6CC (MISCELLANEOUS) ×3 IMPLANT
SPONGE GAUZE 4X4 12PLY (GAUZE/BANDAGES/DRESSINGS) IMPLANT
STAPLE ECHEON FLEX 60 POW ENDO (STAPLE) ×3 IMPLANT
STAPLER CIRC 21 3.5 SULU (STAPLE) ×3 IMPLANT
STAPLER TRANS-ORAL 21MM DST (STAPLE) ×3 IMPLANT
STAPLER VISISTAT 35W (STAPLE) ×3 IMPLANT
STRIP CLOSURE SKIN 1/2X4 (GAUZE/BANDAGES/DRESSINGS) IMPLANT
STRIP PERI DRY VERITAS 60 (STAPLE) ×15 IMPLANT
SUT ETHILON 3 0 PS 1 (SUTURE) ×3 IMPLANT
SUT PROLENE 0 SH 30 (SUTURE) ×6 IMPLANT
SUT RELOAD ENDO STITCH 2 48X1 (ENDOMECHANICALS)
SUT RELOAD ENDO STITCH 2.0 (ENDOMECHANICALS)
SUT VICRYL 0 UR6 27IN ABS (SUTURE) ×3 IMPLANT
SUTURE RELOAD END STTCH 2 48X1 (ENDOMECHANICALS) IMPLANT
SUTURE RELOAD ENDO STITCH 2.0 (ENDOMECHANICALS) IMPLANT
SYR 20CC LL (SYRINGE) ×3 IMPLANT
SYR 50ML LL SCALE MARK (SYRINGE) ×3 IMPLANT
TOWEL OR 17X26 10 PK STRL BLUE (TOWEL DISPOSABLE) ×6 IMPLANT
TRAY FOLEY CATH 14FRSI W/METER (CATHETERS) ×3 IMPLANT
TROCAR BLADELESS 15MM (ENDOMECHANICALS) ×3 IMPLANT
TROCAR XCEL BLADELESS 5X75MML (TROCAR) IMPLANT
TROCAR XCEL NON-BLD 5MMX100MML (ENDOMECHANICALS) ×12 IMPLANT
TROCAR Z-THREAD BLADED 5X100MM (TROCAR) ×3 IMPLANT
TUBING CONNECTING 10 (TUBING) ×3 IMPLANT
TUBING ENDO SMARTCAP (MISCELLANEOUS) ×3 IMPLANT
TUBING FILTER THERMOFLATOR (ELECTROSURGICAL) ×3 IMPLANT

## 2012-01-03 NOTE — Preoperative (Signed)
Beta Blockers   Reason not to administer Beta Blockers:Not Applicable, not on home BB 

## 2012-01-03 NOTE — Anesthesia Preprocedure Evaluation (Addendum)
Anesthesia Evaluation  Patient identified by MRN, date of birth, ID band Patient awake    Reviewed: Allergy & Precautions, H&P , NPO status , Patient's Chart, lab work & pertinent test results  Airway Mallampati: II TM Distance: >3 FB Neck ROM: Full    Dental  (+) Teeth Intact and Dental Advisory Given   Pulmonary neg pulmonary ROS,  breath sounds clear to auscultation  Pulmonary exam normal       Cardiovascular hypertension, Pt. on medications Rhythm:Regular Rate:Normal     Neuro/Psych negative neurological ROS     GI/Hepatic Neg liver ROS, GERD-  Medicated and Controlled,  Endo/Other  Morbid obesity  Renal/GU negative Renal ROS     Musculoskeletal   Abdominal (+) + obese,   Peds  Hematology negative hematology ROS (+)   Anesthesia Other Findings   Reproductive/Obstetrics                          Anesthesia Physical Anesthesia Plan  ASA: III  Anesthesia Plan: General   Post-op Pain Management:    Induction: Intravenous  Airway Management Planned: Oral ETT  Additional Equipment:   Intra-op Plan:   Post-operative Plan: Extubation in OR  Informed Consent: I have reviewed the patients History and Physical, chart, labs and discussed the procedure including the risks, benefits and alternatives for the proposed anesthesia with the patient or authorized representative who has indicated his/her understanding and acceptance.   Dental advisory given  Plan Discussed with: Surgeon and CRNA  Anesthesia Plan Comments:         Anesthesia Quick Evaluation

## 2012-01-03 NOTE — Brief Op Note (Signed)
01/03/2012  11:27 AM  PATIENT:  Carolyn Morrison  51 y.o. female  PRE-OPERATIVE DIAGNOSIS:  morbid obesity   POST-OPERATIVE DIAGNOSIS:  morbid obesity  PROCEDURE:  Procedure(s) (LRB): LAPAROSCOPIC ROUX-EN-Y GASTRIC BYPASS WITH UPPER ENDOSCOPY (N/A) UPPER GI ENDOSCOPY () Umbilical hernia repair  SURGEON:  Surgeon(s) and Role:    * Lodema Pilot, DO - Primary  PHYSICIAN ASSISTANT:   ASSISTANTS: Martin   ANESTHESIA:   general  EBL:  Total I/O In: 3000 [I.V.:3000] Out: 200 [Urine:200]  BLOOD ADMINISTERED:none  DRAINS: Penrose drain in the GJ anastamosis   LOCAL MEDICATIONS USED:  MARCAINE    and LIDOCAINE   SPECIMEN:  No Specimen  DISPOSITION OF SPECIMEN:  N/A  COUNTS:  YES  TOURNIQUET:  * No tourniquets in log *  DICTATION: .Other Dictation: Dictation Number [id number]  PLAN OF CARE: Admit to inpatient   PATIENT DISPOSITION:  PACU - hemodynamically stable.   Delay start of Pharmacological VTE agent (>24hrs) due to surgical blood loss or risk of bleeding: no

## 2012-01-03 NOTE — H&P (View-Only) (Signed)
Patient ID: Carolyn Morrison, female   DOB: 05/04/1961, 51 y.o.   MRN: [id number]  Chief Complaint  Patient presents with  . Bariatric Pre-op    RNY    HPI Carolyn Morrison is a 51 y.o. female.  HPI This patient presents for her preoperative evaluation for a laparoscopic Roux-en-Y gastric bypass next week. She's BMI 61 with comorbidities of arthritis, gastroesophageal reflux, hypertension. Since we last minute she has had her upper GI and laboratory studies and seen by nutritionist and psychologist. She has stopped smoking for the last 3 months. She denies any other significant changes since her last visit.  Past Medical History  Diagnosis Date  . GERD (gastroesophageal reflux disease)   . Arthritis   . Hypertension   . Hyperlipidemia     Past Surgical History  Procedure Date  . Cesarean section 1992  . Cholecystectomy 2001  . Facial reconstruction surgery 1978  . Appendectomy 1976    Family History  Problem Relation Age of Onset  . Diabetes Maternal Aunt     Social History History  Substance Use Topics  . Smoking status: Current Everyday Smoker -- 0.2 packs/day  . Smokeless tobacco: Not on file  . Alcohol Use: Yes    No Known Allergies  Current Outpatient Prescriptions  Medication Sig Dispense Refill  . aspirin 81 MG tablet Take 81 mg by mouth daily.      . B Complex Vitamins (VITAMIN-B COMPLEX PO) Take by mouth.      . CELEBREX 200 MG capsule Take 200 mg by mouth every morning.       . Cinnamon 500 MG capsule Take 500 mg by mouth daily.      . CYMBALTA 60 MG capsule Take 60 mg by mouth every morning.       . fish oil-omega-3 fatty acids 1000 MG capsule Take 2 g by mouth daily.      . glucosamine-chondroitin 500-400 MG tablet Take 1 tablet by mouth 3 (three) times daily.      . magnesium oxide (MAG-OX) 400 MG tablet Take 400 mg by mouth daily.      . metaxalone (SKELAXIN) 800 MG tablet Take 800 mg by mouth 4 (four) times daily.       . MICARDIS HCT 80-12.5 MG per  tablet Take 1 tablet by mouth every morning.       . Multiple Vitamin (MULTIVITAMIN WITH MINERALS) TABS Take 1 tablet by mouth daily.      . NEXIUM 40 MG capsule Take 40 mg by mouth every morning.       . traMADol (ULTRAM) 50 MG tablet Take 25 mg by mouth every 6 (six) hours as needed. For pain      . ZOVIRAX 5 % Apply 1 application topically as needed. Apply to affected areas for break outs        Review of Systems Review of Systems All other review of systems negative or noncontributory except as stated in the HPI  Blood pressure 140/70, pulse 103, temperature 97.6 F (36.4 C), temperature source Temporal, height 5' 6.5" (1.689 m), weight 382 lb (173.274 kg), SpO2 98.00%.  Physical Exam Physical Exam Physical Exam  Nursing note and vitals reviewed. Constitutional: She is oriented to person, place, and time. She appears well-developed and well-nourished. No distress.  HENT:  Head: Normocephalic and atraumatic.  Mouth/Throat: No oropharyngeal exudate.  Eyes: Conjunctivae and EOM are normal. Pupils are equal, round, and reactive to light. Right eye exhibits no discharge. Left   eye exhibits no discharge. No scleral icterus.  Neck: Normal range of motion. Neck supple. No tracheal deviation present.  Cardiovascular: Normal rate, regular rhythm, normal heart sounds and intact distal pulses.   Pulmonary/Chest: Effort normal and breath sounds normal. No stridor. No respiratory distress. She has no wheezes.  Abdominal: Soft. Bowel sounds are normal. She exhibits no distension and no mass. There is no tenderness. There is no rebound and no guarding. WHSS Musculoskeletal: Normal range of motion. She exhibits no edema and no tenderness. Walking with cane Neurological: She is alert and oriented to person, place, and time.  Skin: Skin is warm and dry. No rash noted. She is not diaphoretic. No erythema. No pallor.  Psychiatric: She has a normal mood and affect. Her behavior is normal. Judgment and  thought content normal.      Data Reviewed   Assessment    Morbid obesity with a BMI of 61 and comorbidities of hypertension, arthritis, gastroesophageal reflux Think that she is a fine candidate for Roux-en-Y gastric bypass and she should benefit from this. Currently her lifestyle is limited from the arthritis and a think the weight loss will substantially improve this. We would hope to improve her reflux, her arthritis, and hopefully her hypertension is well. We are planning for surgery next week. She has stopped smoking for the last 3 months. We will repeat nicotine test again today with her preop labs.  The risks of infection, bleeding, pain, scarring, weight regain, too little or too much weight loss, vitamin deficiencies and need for lifelong vitamin supplementation, hair loss, need for protein supplementation, leaks, stricture, reflux, food intolerance, need for reoperation , need for open surgery, injury to spleen or surrounding structures, DVT's, PE, and death again discussed with the patient and the patient expressed understanding and desires to proceed with laparoscopic RYGB, possible open, intraoperative endoscopy. I explained that due to her prior procedures, she may be higher risk for open conversion.     Plan    We will plan for RYGB next week.       [name] DAVID 12/28/2011, 12:03 PM    

## 2012-01-03 NOTE — Anesthesia Procedure Notes (Signed)
Procedure Name: Intubation Date/Time: 01/03/2012 7:30 AM Performed by: Randon Goldsmith CATHERINE PAYNE Pre-anesthesia Checklist: Patient identified, Emergency Drugs available, Suction available and Patient being monitored Patient Re-evaluated:Patient Re-evaluated prior to inductionOxygen Delivery Method: Circle system utilized Preoxygenation: Pre-oxygenation with 100% oxygen Intubation Type: IV induction Ventilation: Mask ventilation without difficulty Laryngoscope Size: Mac and 4 Grade View: Grade I Tube type: Oral Tube size: 7.5 mm Number of attempts: 1 Airway Equipment and Method: Patient positioned with wedge pillow and Stylet Placement Confirmation: ETT inserted through vocal cords under direct vision,  positive ETCO2 and breath sounds checked- equal and bilateral Secured at: 21 cm Tube secured with: Tape Dental Injury: Teeth and Oropharynx as per pre-operative assessment  Comments: Intubation by Roxan Hockey

## 2012-01-03 NOTE — Progress Notes (Signed)
Spoke to Dr. Biagio Quint on phone, informed him that patient has ambulated twice, and is using IS but on continuous pulse ox reading 92-93% on 4l o2. Patient c/o pain to abd, shoulder blades and upper back and was inquiring about a pain pump but I have only been medicating with 2mg  of morphine due to oxygen saturations, MD agreeable. Orders given for toradol and MD transferred into room to speak with patient.

## 2012-01-03 NOTE — Transfer of Care (Signed)
Immediate Anesthesia Transfer of Care Note  Patient: Carolyn Morrison Loss  Procedure(s) Performed: Procedure(s) (LRB): LAPAROSCOPIC ROUX-EN-Y GASTRIC BYPASS WITH UPPER ENDOSCOPY (N/A) UPPER GI ENDOSCOPY ()  Patient Location: PACU  Anesthesia Type: General  Level of Consciousness: awake, alert  and oriented  Airway & Oxygen Therapy: Patient Spontanous Breathing and Patient connected to face mask oxygen  Post-op Assessment: Report given to PACU RN and Post -op Vital signs reviewed and stable  Post vital signs: Reviewed and stable  Complications: No apparent anesthesia complications

## 2012-01-03 NOTE — Interval H&P Note (Signed)
History and Physical Interval Note:  01/03/2012 7:13 AM  Zenovia Jarred Whiters  has presented today for surgery, with the diagnosis of morbid obesity   The various methods of treatment have been discussed with the patient and family. After consideration of risks, benefits and other options for treatment, the patient has consented to  Procedure(s) (LRB): LAPAROSCOPIC ROUX-EN-Y GASTRIC BYPASS WITH UPPER ENDOSCOPY (N/A) as a surgical intervention .  The patient's history has been reviewed, patient examined, no change in status, stable for surgery.  I have reviewed the patient's chart and labs.  Questions were answered to the patient's satisfaction.  Pt. Seen in preop area.  She denies any changes from prior exam.  Nicotine test again negative. The risks of infection, bleeding, pain, scarring, weight regain, too little or too much weight loss, vitamin deficiencies and need for lifelong vitamin supplementation, hair loss, need for protein supplementation, leaks, stricture, reflux, food intolerance, need for reoperation , need for open surgery, injury to spleen or surrounding structures, DVT's, PE, and death again discussed with the patient and the patient expressed understanding and desires to proceed with laparoscopic RYGB, possible open, intraoperative endoscopy.     Lodema Pilot DAVID

## 2012-01-03 NOTE — Anesthesia Postprocedure Evaluation (Signed)
Anesthesia Post Note  Patient: Carolyn Morrison  Procedure(s) Performed: Procedure(s) (LRB): LAPAROSCOPIC ROUX-EN-Y GASTRIC BYPASS WITH UPPER ENDOSCOPY (N/A) UPPER GI ENDOSCOPY ()  Anesthesia type: General  Patient location: PACU  Post pain: Pain level controlled  Post assessment: Post-op Vital signs reviewed  Last Vitals: BP 133/83  Pulse 92  Temp 36.8 C (Oral)  Resp 18  Ht 5\' 6"  (1.676 m)  Wt 375 lb 2 oz (170.156 kg)  BMI 60.55 kg/m2  SpO2 92%  Post vital signs: Reviewed  Level of consciousness: sedated  Complications: No apparent anesthesia complications

## 2012-01-04 ENCOUNTER — Inpatient Hospital Stay (HOSPITAL_COMMUNITY): Payer: 59

## 2012-01-04 ENCOUNTER — Encounter (HOSPITAL_COMMUNITY): Payer: Self-pay | Admitting: General Surgery

## 2012-01-04 LAB — COMPREHENSIVE METABOLIC PANEL
ALT: 65 U/L — ABNORMAL HIGH (ref 0–35)
AST: 61 U/L — ABNORMAL HIGH (ref 0–37)
Albumin: 2.8 g/dL — ABNORMAL LOW (ref 3.5–5.2)
Calcium: 9 mg/dL (ref 8.4–10.5)
GFR calc Af Amer: >90 mL/min (ref >90)
Sodium: 135 mEq/L (ref 135–145)
Total Protein: 6.4 g/dL (ref 6.0–8.3)

## 2012-01-04 LAB — CBC WITH DIFFERENTIAL/PLATELET
Basophils Absolute: 0 10*3/uL (ref 0.0–0.1)
Basophils Relative: 0 % (ref 0–1)
Eosinophils Absolute: 0 10*3/uL (ref 0.0–0.7)
Eosinophils Relative: 0 % (ref 0–5)
MCH: 29.4 pg (ref 26.0–34.0)
MCV: 88.2 fL (ref 78.0–100.0)
Neutrophils Relative %: 74 % (ref 43–77)
Platelets: 263 10*3/uL (ref 150–400)
RDW: 14.2 % (ref 11.5–15.5)
WBC: 9.1 10*3/uL (ref 4.0–10.5)

## 2012-01-04 IMAGING — RF DG UGI W/ GASTROGRAFIN
10 series · 10 of 10 positions shown · non-contrast
Comparison: Preoperative study [DATE].

CLINICAL DATA: Status post gastric bypass surgery.

UPPER GI SERIES WITH KUB
TECHNIQUE: Routine upper GI series was performed with water
soluble contrast.
Fluoroscopy Time: 1.13 minutes

[Series 1: run · 1 of 1 slices shown (1 of 8)]
[im 1/1]
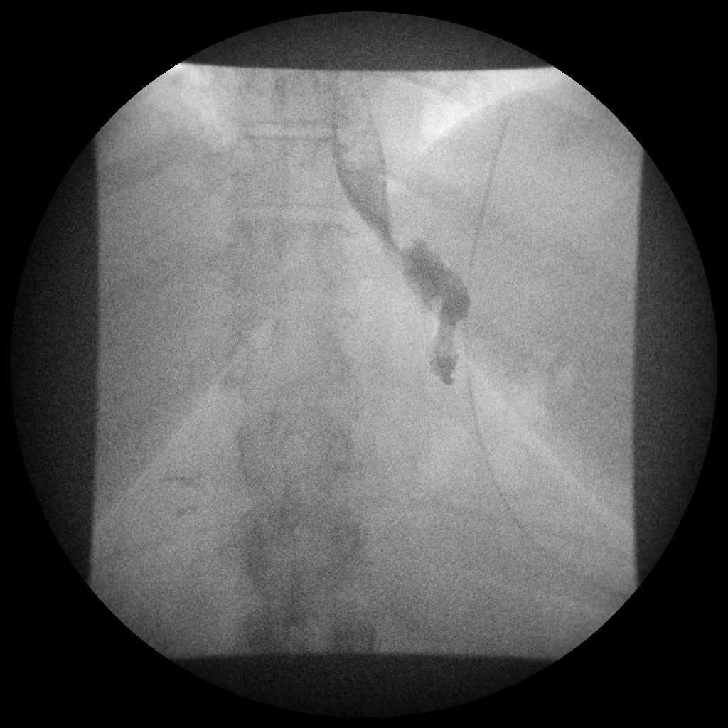

[Series 2: run · 1 of 1 slices shown (2 of 8)]
[im 1/1]
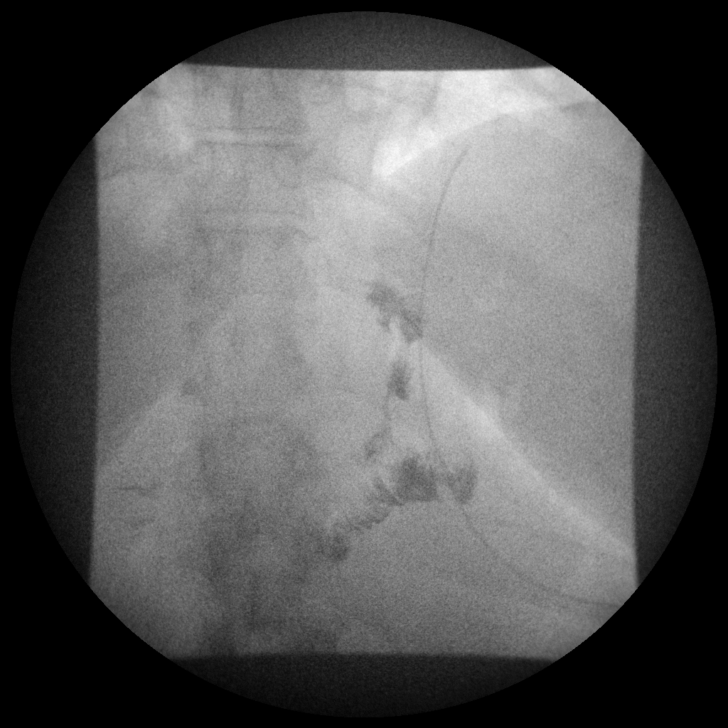

[Series 3: run · 1 of 1 slices shown (3 of 8)]
[im 1/1]
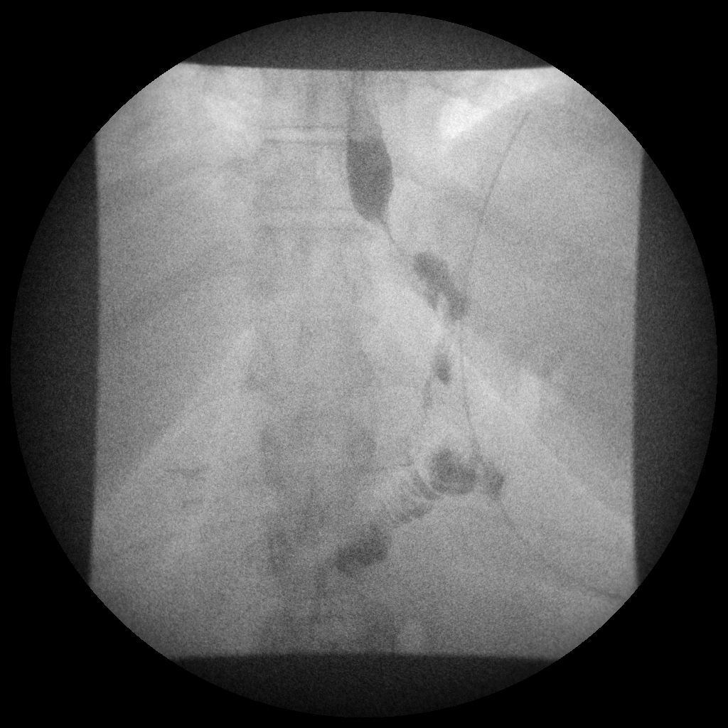

[Series 4: run · 1 of 1 slices shown (4 of 8)]
[im 1/1]
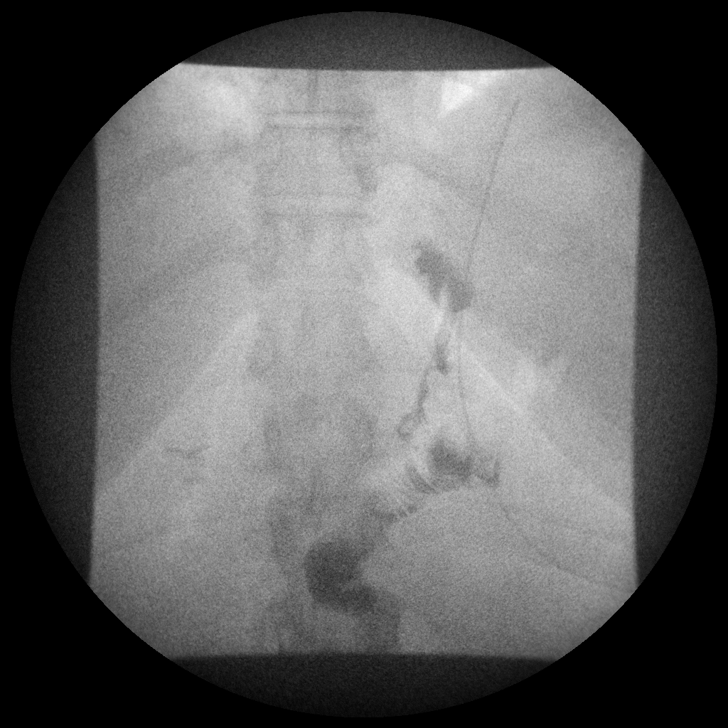

[Series 5: run · 1 of 1 slices shown (5 of 8)]
[im 1/1]
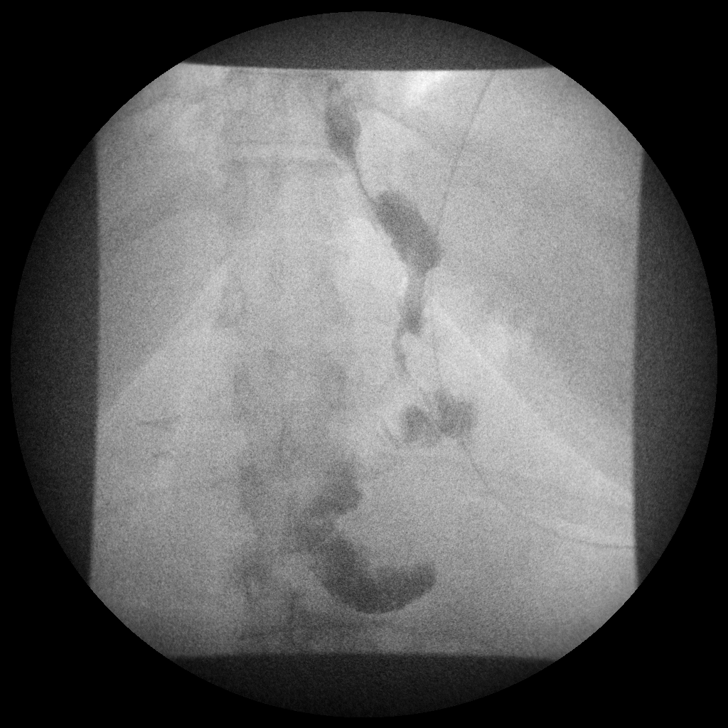

[Series 6: run · 1 of 1 slices shown (6 of 8)]
[im 1/1]
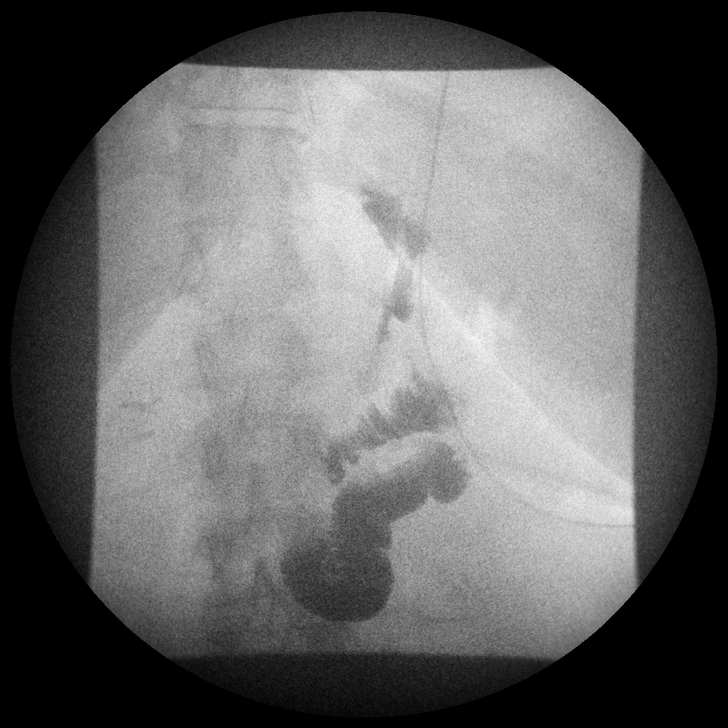

[Series 7: run · 1 of 1 slices shown (7 of 8)]
[im 1/1]
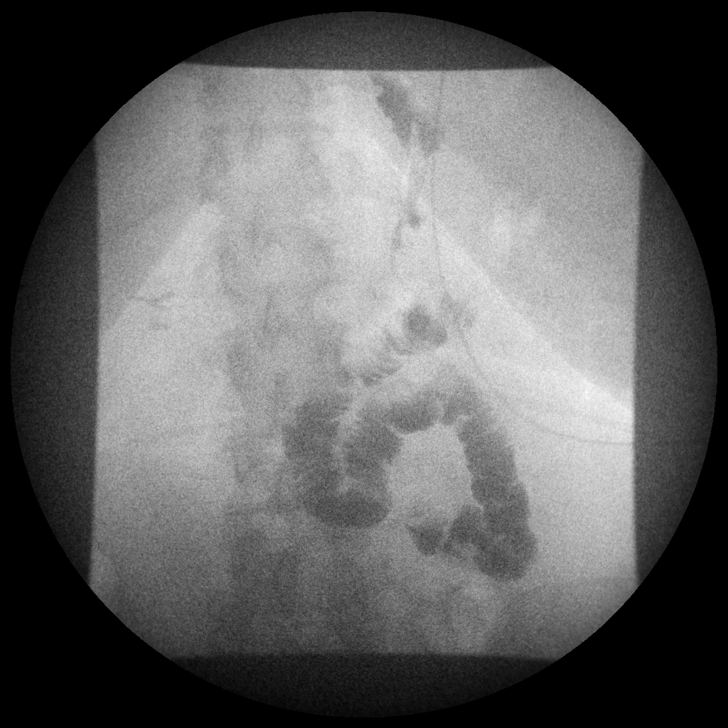

[Series 8: run · 1 of 1 slices shown (8 of 8)]
[im 1/1]
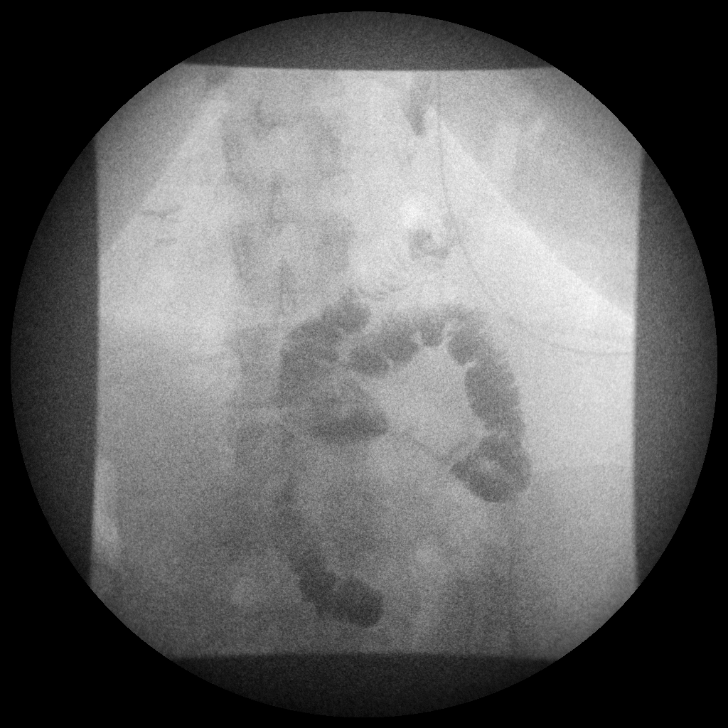

[Series 1001: view not recorded · 0.20mm/px · 1 of 1 slices shown (1 of 2)]
[im 1/1]
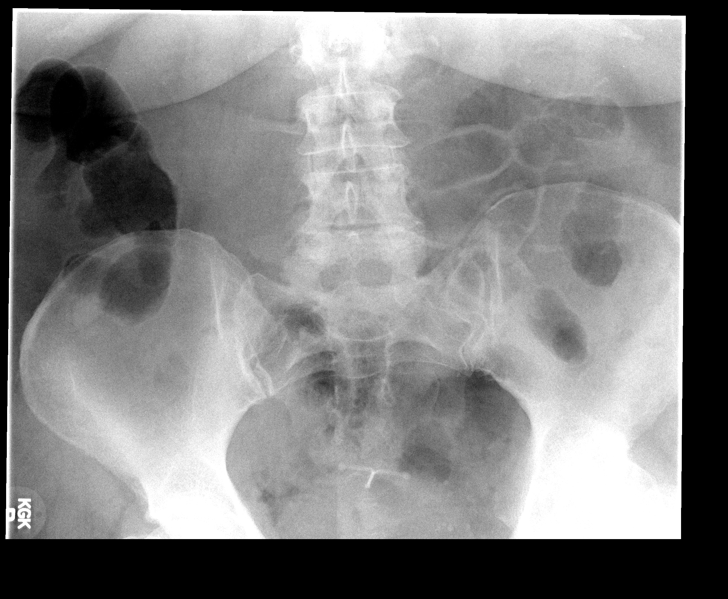

[Series 1002: view not recorded · 0.20mm/px · 1 of 1 slices shown (2 of 2)]
[im 1/1]
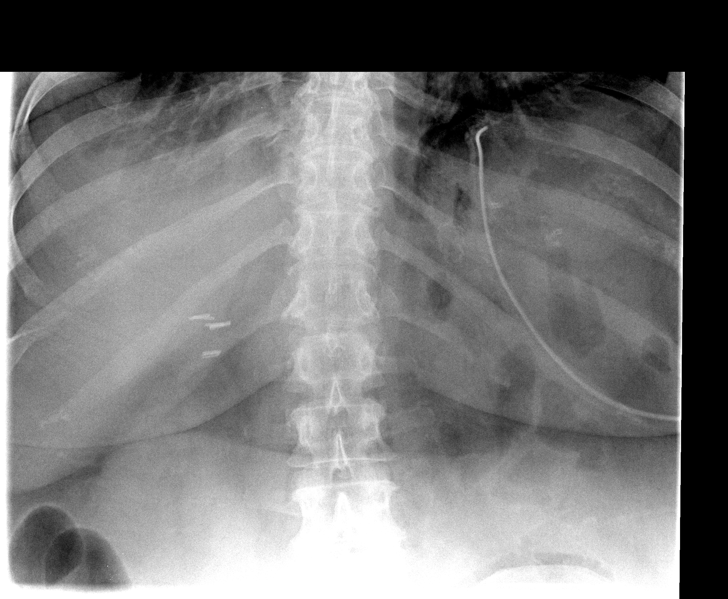

[10 of 10 positions shown; findings below may reference images not displayed]

FINDINGS: The distal esophagus appears normal.  There is a small
residual gastric pouch.  Contrast medium enters the small bowel
through the anastomoses.  No findings for leak/extravasation.  The
small bowel loops are normal.
IMPRESSION: Normal postoperative appearance status post gastric bypass surgery
without complicating features.

## 2012-01-04 MED ORDER — IOHEXOL 300 MG/ML  SOLN: 30.0000 mL | Freq: Once | INTRAMUSCULAR | Status: AC | PRN

## 2012-01-04 MED ORDER — KETOROLAC TROMETHAMINE 30 MG/ML IJ SOLN
30.0000 mg | Freq: Four times a day (QID) | INTRAMUSCULAR | Status: DC | PRN
  Administered 2012-01-04: 30 mg via INTRAVENOUS
  Filled 2012-01-04: qty 1

## 2012-01-04 NOTE — Progress Notes (Signed)
UGI results called to Dr. Biagio Quint; advance to POD #1 diet; Lurena Joiner, RN aware of results and orders. Talmadge Chad, RN Bariatric Nurse Coordinator

## 2012-01-04 NOTE — Progress Notes (Signed)
Still with abdominal pain but better than yesterday.  UGI negative.  Tolerating water.  Will increase quantity of liquids and use oral pain meds.  Continue to mobilize

## 2012-01-04 NOTE — Care Management Note (Signed)
    Page 1 of 1   01/04/2012     11:46:45 AM   CARE MANAGEMENT NOTE 01/04/2012  Patient:  Carolyn Morrison, Carolyn Morrison   Account Number:  [id number]  Date Initiated:  01/04/2012  Documentation initiated by:  Lorenda Ishihara  Subjective/Objective Assessment:   51 yo female admitted s/p lap gastric bypass. PTA lived at home.     Action/Plan:   Anticipated DC Date:  01/05/2012   Anticipated DC Plan:  HOME/SELF CARE      DC Planning Services  CM consult      Choice offered to / List presented to:             Status of service:  Completed, signed off Medicare Important Message given?   (If response is "NO", the following Medicare IM given date fields will be blank) Date Medicare IM given:   Date Additional Medicare IM given:    Discharge Disposition:  HOME/SELF CARE  Per UR Regulation:  Reviewed for med. necessity/level of care/duration of stay  If discussed at Long Length of Stay Meetings, dates discussed:    Comments:

## 2012-01-04 NOTE — Op Note (Signed)
Carolyn Morrison, Carolyn Morrison NO.:  [id number]  MEDICAL RECORD NO.:  [id number]  LOCATION:  1526                         FACILITY:  [facility]  PHYSICIAN:  Lodema Pilot, MD       DATE OF BIRTH:  10-Feb-1961  DATE OF PROCEDURE:  01/03/2012 DATE OF DISCHARGE:                              OPERATIVE REPORT   PROCEDURE:  Laparoscopic Roux-en-Y gastric bypass with intraoperative upper endoscopy and umbilical hernia repair.  PREOPERATIVE DIAGNOSES:  Morbid obesity, arthritis, reflux and hypertension.  POSTOPERATIVE DIAGNOSES:  Morbid obesity, arthritis, reflux, hypertension and umbilical hernia.  SURGEON:  Lodema Pilot, MD  ASSISTANT:  Thornton Park. Daphine Deutscher, MD  ANESTHESIA:  General endotracheal tube anesthesia with 60 mL of 1% lidocaine with epinephrine and 0.25% Marcaine in a 50:50 mixture.  FLUIDS:  3100 mL of crystalloid.  ESTIMATED BLOOD LOSS:  100 mL.  DRAINS:  A 19-French Blake drain was placed near the gastrojejunal anastomosis.  SPECIMENS:  None.  COMPLICATIONS:  None apparent.  FINDINGS:  Moderate-sized umbilical hernia with incarcerated omental fat, which was taken down mostly using blunt dissection.  The Harmonic scalpel was used for this as well.  Primary closure of the umbilical defect with 0 Prolene sutures.  The circular stapled the gastrojejunal anastomosis with a 21-mm EEA stapler and a linear stapled side-to-side functional anastomosis for the jejunostomy.  INDICATION FOR PROCEDURE:  Carolyn Morrison is a 51 year old female with BMI of 63 with comorbidities of arthritis, gastroesophageal reflux disease, hypertension, who failed medical weight loss attempts and needed a durable weight loss solution.  She was previously a smoker and she had been smoke-free since June.  Nicotine test was performed preoperatively was negative to confirm smoking cessation.  OPERATIVE DETAILS:  The patient was seen and evaluated in the preoperative area and risks and  benefits of procedure were again discussed in lay terms.  Informed consent was obtained.  Subcu heparin was administered and prophylactic antibiotics was given as well.  She was taken to the operating room and placed on the table in supine position.  General endotracheal tube anesthesia was obtained and Foley catheter was placed.  Her abdomen was prepped and draped in a standard surgical fashion and a 5-mm Optiview trocar was used to access the peritoneum in the left upper quadrant.  Pneumoperitoneum was obtained and the laparoscope was introduced.  There was no evidence of bowel injury upon entry.  A 5-mm left rectus port was placed, a 5-mm right upper quadrant port and 15-mm right rectus port was placed, all under direct visualization.  Additional 5-mm port was placed laterally for second assistant working port.  She had some incarcerated omentum in the umbilical hernia defect.  Most of this was reduced with blunt dissection.  The Harmonic scalpel was also used to take down some omental adhesions in this area.  There was no evidence of bowel adhered to the abdominal wall.  With the omentum mobilized, I rolled the omentum up over the liver and elevated the transverse colon.  The ligament of Treitz was identified and I measured 50 cm from the ligament of Treitz and divided the small bowel with a 60 mm, 2.5 mm staple load.  I divided  the mesentery down to its base using Harmonic scalpel, taking care to avoid undermining each limb of the intestine and a suture was placed on the Roux limb for identification.  Then, I measured out 100 cm and enterotomy was made on the antimesenteric side of the bowel and both the BP limb and the Roux limb, and side-to-side stapled anastomosis was created with a 60-mm white load stapler.  Three sutures were placed through the common enterotomy and the sutures were elevated and a blue load was fired under the sutures to close the common enterotomy transversely  across the bowel.  The staples appeared well formed and the staple line was hemostatic.  However, there was a burn mark from the Harmonic scalpel, making the enterotomy on the Roux limb side of the anastomosis.  So, in this area, I concern that it was not completely closed.  I placed some simple sutures and some Lembert sutures using the 2-0 Surgidac suture to ensure adequate closure of the common enterotomy. A crotch stitch was placed as well and alignment stitch was placed as well, climbing the BP limb with the Roux limb, and the JJ defect was closed with a running 2-0 Surgidac suture.  The anastomosis appeared adequate and Tisseel fibrin glue was placed over the staple line.  The omentum was divided up to the colon and Carolyn Morrison liver retractor was placed through the epigastrium through a separate stab incision.  I retracted the left lobe of the liver, which was enlarged and fatty.  The pars flaccida was opened up into the lesser sac and a single firing of the 2.5-mm staple load.  Covered repair strips was used via the lesser curve vessels up to the stomach.  Then, a window was created posterior to the stomach in the lesser sac and the fat pad was taken off at the angle of His, opened up area of the peritoneum over the angle of His.  A transverse firing of a gold uncovered staple load was taken and then subsequent firing of the gold staple loads covered with Peri strips. Peri strips were taken up towards the angle of His creating the lateral wall of the gastric pouch.  We had measured out 5 cm, attempting to make this a 5 cm long pouch.  The Steri-Strips and staples appeared to almost completely transect the stomach; however, appeared that there was still a small connection to the remnant stomach and so, final firing of a blue staple load without Peri-Strips was taken to complete the transection. Staple line appeared hemostatic and the Roux limb appeared approach without tension to the  end of gastric pouch.  Then, the gastrotomy was made on the distal portion of the gastric pouch to the staple line, and the 21-mm OrVil anvil was passed through the mouth and exited through the gastrotomy and the tubing was cut and removed through the 15-mm port site.  I changed the clothes and then placed two 2-0 Surgidac Lembert sutures on each side of the anvil to tighten the gastric opening and the end of the Roux limb was opened and I enlarged the 15-mm port site to accommodate the 21-mm EEA stapler, which was passed through the abdominal wall with a plastic skirt and passed into the open end of the Roux limb, and the spike was extruded through the antimesenteric portion of the Roux limb and mated with the anvil.  The stapler was closed down and the anastomosis was created, and the stapler and anvil were removed and the  skirt was pulled over the anvil to minimize contamination of the abdominal wall.  The donuts were opened on the back table and appeared to be complete and 2-0 Surgidac sutures were placed on each side of the anastomosis, antitension type stitches to the Roux limb to the gastric pouch.  Then, the Roux limb mesentery was undermined and final firing of the 60 mm white load stapler was used to close the enterotomy of the Roux limb and the staples appeared well formed.  The resected portion of the candy-cane of the Roux limb was placed in an EndoCatch bag and removed from the abdomen.  The bowel was clamped.  The abdomen was filled with sterile saline solution and Dr. Daphine Deutscher performed EGD and upper endoscopy, and the pouch appeared to be adequate size.  There was no evidence of internal bleeding and we inflated the anastomosis with air and there was no evidence of air leakage.  The air was suctioned from the abdomen and the scope was removed.  The fluid was suctioned and Tisseel fibrin glue was placed over the anastomosis on the staple line and the gastric pouch.  A  19-French Blake drain was placed posterior to the gastrojejunal anastomosis and along the side of the gastric wall pouch and exited through the left upper quadrant trocar site and sutured in place with a nylon drain stitch.  The J-J anastomosis appeared to be adequate without any evidence of leak and we then closed the umbilical fascial defect with 0 Prolene sutures and Endo Close device to close the defect to prevent postoperative bowel herniation.  The sutures were secured and appeared to close the defect.  The 50-mm port site was closed as well with 0 Vicryl sutures using Endo Close device and the fascia appeared well approximated.  The abdomen was reinsufflated and abdominal wall closure was noted to be adequate without any evidence of bowel injury.  The anastomoses appeared adequate and the remainder of the fluid was suctioned from the abdomen.  The remainder of the trocars removed and skin edges were injected with 60 mL of 1% lidocaine with epinephrine and 0.25% Marcaine in a 50:50 mixture, and the skin edges were approximated with 4-0 Monocryl subcuticular suture.  Skin was washed and dried and Dermabond was applied.  All sponge, needle, and instrument counts were correct at the end of the case.  The patient tolerated the procedure well without apparent complication.  Foley catheter was removed at the end of the case and the patient was transferred to the recovery room.          ______________________________ Lodema Pilot, MD     BL/MEDQ  D:  01/03/2012  T:  01/04/2012  Job:  [id number]

## 2012-01-04 NOTE — Progress Notes (Signed)
1 Day Post-Op  Subjective: C/o abdominal pain today. No nausea  Objective: Vital signs in last 24 hours: Temp:  [97.8 F (36.6 C)-99.3 F (37.4 C)] 98.4 F (36.9 C) (08/28 0541) Pulse Rate:  [70-92] 82  (08/28 0541) Resp:  [15-20] 18  (08/28 0541) BP: (118-158)/(72-87) 140/76 mmHg (08/28 0541) SpO2:  [91 %-96 %] 93 % (08/28 0643) Last BM Date: 01/03/12  Intake/Output from previous day: 08/27 0701 - 08/28 0700 In: 4635.4 [I.V.:4635.4] Out: 1805 [Urine:1650; Drains:155] Intake/Output this shift:    General appearance: alert, cooperative and no distress Resp: nonlabored Cardio: normal rate, regular GI: soft, appropriate tenderness, ND, incisions without infection, JP ss Extremities: SCD's  Lab Results:   Basename 01/04/12 0355  WBC 9.1  HGB 11.2*  HCT 33.6*  PLT 263   BMET  Basename 01/04/12 0355  NA 135  K 4.0  CL 101  CO2 26  GLUCOSE 128*  BUN 9  CREATININE 0.65  CALCIUM 9.0   PT/INR No results found for this basename: LABPROT:2,INR:2 in the last 72 hours ABG No results found for this basename: PHART:2,PCO2:2,PO2:2,HCO3:2 in the last 72 hours  Studies/Results: Dg Chest 2 View  01/02/2012  *RADIOLOGY REPORT*  Clinical Data: 51 year old female preoperative study gastric bypass.  Shortness of breath hypertension.  CHEST - 2 VIEW  Comparison: None.  Findings: Low lung volumes.  Increased interstitial markings which likely at least in part reflect crowding.  Cardiac size and mediastinal contours are within normal limits.  Visualized tracheal air column is within normal limits.  No pneumothorax, pulmonary edema, pleural effusion or confluent pulmonary opacity.  Right upper quadrant surgical clips. No acute osseous abnormality identified.  IMPRESSION: Low lung volumes. No acute cardiopulmonary abnormality.   Original Report Authenticated By: Harley Hallmark, M.D.     Anti-infectives: Anti-infectives     Start     Dose/Rate Route Frequency Ordered Stop   01/03/12  0600   ertapenem (INVANZ) 1 g in sodium chloride 0.9 % 50 mL IVPB        1 g 100 mL/hr over 30 Minutes Intravenous 60 min pre-op 01/03/12 0534 01/03/12 0733          Assessment/Plan: s/p Procedure(s) (LRB): LAPAROSCOPIC ROUX-EN-Y GASTRIC BYPASS WITH UPPER ENDOSCOPY (N/A) UPPER GI ENDOSCOPY () UGI and advance diet if UGI okay.  continue to mobilize.  pain control  LOS: 1 day    Lodema Pilot DAVID 01/04/2012

## 2012-01-04 NOTE — Progress Notes (Signed)
Pt just returned from UGI; alert and oriented; VSS; denies any nausea or vomiting; denies flatus or BM; voiding without difficulty; ambulating without difficulty; c/o some abdominal discomfort with relief from prn meds; using incentive spirometer; pt already has follow up appts with [facility] and [facility]; aware of BELT program and support group; discharge instructions given for pt to review.  GASTRIC BYPASS/SLEEVE DISCHARGE INSTRUCTIONS  Drs. Fredrik Rigger, Hoxworth, Wilson, and [place] Call if you have any problems.   Call [phone] and ask for the surgeon on call.    If you need immediate assistance come to the ER at [facility]. Tell the ER personnel that you are a new post-op gastric bypass patient. Signs and symptoms to report:   Severe vomiting or nausea. If you cannot tolerate clear liquids for longer than 1 day, you need to call your surgeon.    Abdominal pain which does not get better after taking your pain medication   Fever greater than 101 F degree   Difficulty breathing   Chest pain    Redness, swelling, drainage, or foul odor at incision sites    If your incisions open or pull apart   Swelling or pain in calf (lower leg)   Diarrhea, frequent watery, uncontrolled bowel movements.   Constipation, (no bowel movements for 3 days) if this occurs, Take Milk of Magnesia, 2 tablespoons by mouth, 3 times a day for 2 days if needed.  Call your doctor if constipation continues. Stop taking Milk of Magnesia once you have had a bowel movement. You may also use Miralax according to the label instructions.   Anything you consider "abnormal for you".   Normal side effects after Surgery:   Unable to sleep at night or concentrate   Irritability   Being tearful (crying) or depressed   These are common complaints, possibly related to your anesthesia, stress of surgery and change in lifestyle, that usually go away a few weeks after surgery.  If these feelings continue, call your medical doctor.  Wound  Care You may have surgical glue, steri-strips, or staples over your incisions after surgery.  Surgical glue:  Looks like a clear film over your incisions and will wear off gradually. Steri-strips: Strips of tape over your incisions. You may notice a yellowish color on the skin underneath the steri-strips. This is a substance used to make the steri-strips stick better. Do not pull the steri-strips off - let them fall off.  Staples: Cherlynn Polo may be removed before you leave the hospital. If you go home with staples, call [facility] [phone]) for an appointment with your surgeon's nurse to have staples removed in 7 - 10 days. Showering: You may shower two days after your surgery unless otherwise instructed by your surgeon. Wash gently around wounds with warm soapy water, rinse well, and gently pat dry.  If you have a drain, you may need someone to hold this while you shower. Avoid tub baths until staples are removed and incisions are healed.    Medications   Medications should be liquid or crushed if larger than the size of a dime.  Extended release pills should not be crushed.   Depending on the size and number of medications you take, you may need to stagger/change the time you take your medications so that you do not over-fill your pouch.    Make sure you follow-up with your primary care physician to make medication adjustments needed during rapid weight loss and life-style adjustment.   If you  are diabetic, follow up with the doctor that prescribes your diabetes medication(s) within one week after surgery and check your blood sugar regularly.   Do not drive while taking narcotics!   Do not take acetaminophen (Tylenol) and Roxicet or Lortab Elixir at the same time since these pain medications contain acetaminophen.  Diet at home: (First 2 Weeks) You will see the nutritionist two weeks after your surgery. She will advance your diet if you are tolerating liquids well. Once at home,  if you have severe vomiting or nausea and cannot tolerate clear liquids lasting longer than 1 day, call your surgeon.  Begin high protein shake 2 ounces every 3 hours, 5 - 6 times per day.  Gradually increase the amount you drink as tolerated.  You may find it easier to slowly sip shakes throughout the day.  It is important to get your proteins in first.   Protein Shake   Drink at least 2 ounces of shake 5-6 times per day   Each serving of protein shakes should have a minimum of 15 grams of protein and no more than 5 grams of carbohydrate    Increase the amount of protein shake you drink as tolerated   Protein powder may be added to fluids such as non-fat milk or Lactaid milk (limit to 20 grams added protein powder per serving   The initial goal is to drink at least 8 ounces of protein shake/drink per day (or as directed by the nutritionist). Some examples of protein shakes are [employer], Dillard's, EAS Edge HP, and Unjury. Hydration   Gradually increase the amount of water and other liquids as tolerated (See Acceptable Fluids)   Gradually increase the amount of protein shake as tolerated     Sip fluids slowly and throughout the day   May use Sugar substitutes, use sparingly (limit to 6 - 8 packets per day). Your fluid goal is 64 ounces of fluid daily. It may take a few weeks to build up to this.         32 oz (or more) should be clear liquids and 32 oz (or more) should be full liquids.         Liquids should not contain sugar, caffeine, or carbonation! Acceptable Fluids Clear Liquids:   Water or Sugar-free flavored water, Fruit H2O   Decaffeinated coffee or tea (sugar-free)   Crystal Lite, Wyler's Lite, Minute Maid Lite   Sugar-free Jell-O   Bouillon or broth   Sugar-free Popsicle:   *Less than 20 calories each; Limit 1 per day   Full Liquids:              Protein Shakes/Drinks + 2 choices per day of other full liquids shown below.    Other full liquids must be: No more than  12 grams of Carbs per serving,  No more than 3 grams of Fat per serving   Strained low-fat cream soup   Non-Fat milk   Fat-free Lactaid Milk   Sugar-free yogurt (Dannon Lite & Fit) Vitamins and Minerals (Start 1 day after surgery unless otherwise directed)   2 Chewable Multivitamin / Multimineral Supplement (i.e. Centrum for Adults)   Chewable Calcium Citrate with Vitamin D-3. Take 1500 mg each day.           (Example: 3 Chewable Calcium Plus 600 with Vitamin D-3 can be found at [facility])         Vitamin B-12, 350 - 500 micrograms (oral tablet) each day  Do not mix multivitamins containing iron with calcium supplements; take 2 hours   apart   Do not substitute Tums (calcium carbonate) for your calcium   Menstruating women and those at risk for anemia may need extra iron. Talk with your doctor to see if you need additional iron.    If you need extra iron:  Total daily Iron recommendations (including Vitamins) = 50 - 100 mg Iron/day Do not stop taking or change any vitamins or minerals until you talk to your nutritionist or surgeon. Your nutritionist and / or physician must approve all vitamin and mineral supplements. Exercise For maximum success, begin exercising as soon as your doctor recommends. Make sure your physician approves any physical activity.   Depending on fitness level, begin with a simple walking program   Walk 5-15 minutes each day, 7 days per week.    Slowly increase until you are walking 30-45 minutes per day   Consider joining our BELT program. [phone] or email [email or web address] Things to remember:    You may have sexual relations when you feel comfortable. It is VERY important for female patients to use a reliable birth control method. Fertility often increases after surgery. Do not get pregnant for at least 18 months.   It is very important to keep all follow up appointments with your surgeon, nutritionist, primary care physician, and behavioral health practitioner. After  the first year, please follow up with your bariatric surgeon at least once a year in order to maintain best weight loss results.  [facility]: [phone] [facility]: [phone]   Free counseling is available for you and your family through collaboration between [facility] and [place]. Please call [phone] and leave a message.    Consider purchasing a medical alert bracelet that says you had gastric bypass surgery.    The [facility] has a free Bariatric Surgery Support Group that meets monthly, the 3rd Thursday, 6 pm, Classroom #1, [employer]. You may register online at [email or web address], but registration is not necessary. Select Classes and Support Groups, Bariatric Surgery, or Call [phone]   Do not return to work or drive until cleared by your surgeon   Use your CPAP when sleeping if applicable   Do not lift anything greater than ten pounds for at least two weeks  Talmadge Chad, RN BAriatric Nurse Coordinator

## 2012-01-05 LAB — CBC WITH DIFFERENTIAL/PLATELET
Basophils Relative: 0 % (ref 0–1)
HCT: 34.2 % — ABNORMAL LOW (ref 36.0–46.0)
Hemoglobin: 11.5 g/dL — ABNORMAL LOW (ref 12.0–15.0)
MCH: 29.6 pg (ref 26.0–34.0)
MCHC: 33.6 g/dL (ref 30.0–36.0)
MCV: 88.1 fL (ref 78.0–100.0)
Monocytes Absolute: 0.6 10*3/uL (ref 0.1–1.0)
Monocytes Relative: 8 % (ref 3–12)
Neutro Abs: 4.9 10*3/uL (ref 1.7–7.7)

## 2012-01-05 MED ORDER — OXYCODONE-ACETAMINOPHEN 5-325 MG/5ML PO SOLN: 5.0000 mL | ORAL | Status: DC | PRN

## 2012-01-05 MED ORDER — ONDANSETRON 4 MG PO TBDP: 4.0000 mg | ORAL_TABLET | Freq: Three times a day (TID) | ORAL | Status: AC | PRN

## 2012-01-05 NOTE — Progress Notes (Signed)
Pt alert and oriented; VSS; tolerating fluids well; denies any nausea or vomiting; no flatus or BM; voiding without difficulty; ambulating without difficulty; c/o some abdominal discomfort with relief from prn meds; using incentive spirometer; pt already has follow up appts with [facility] and [facility]; pt aware of BELT program and support group; discharge instructions reviewed and pt verbalized understanding of.  GASTRIC BYPASS/SLEEVE DISCHARGE INSTRUCTIONS  Drs. Fredrik Rigger, Hoxworth, [place], and [place] Call if you have any problems.   Call [phone] and ask for the surgeon on call.    If you need immediate assistance come to the ER at [facility]. Tell the ER personnel that you are a new post-op gastric bypass patient. Signs and symptoms to report:   Severe vomiting or nausea. If you cannot tolerate clear liquids for longer than 1 day, you need to call your surgeon.    Abdominal pain which does not get better after taking your pain medication   Fever greater than 101 F degree   Difficulty breathing   Chest pain    Redness, swelling, drainage, or foul odor at incision sites    If your incisions open or pull apart   Swelling or pain in calf (lower leg)   Diarrhea, frequent watery, uncontrolled bowel movements.   Constipation, (no bowel movements for 3 days) if this occurs, Take Milk of Magnesia, 2 tablespoons by mouth, 3 times a day for 2 days if needed.  Call your doctor if constipation continues. Stop taking Milk of Magnesia once you have had a bowel movement. You may also use Miralax according to the label instructions.   Anything you consider "abnormal for you".   Normal side effects after Surgery:   Unable to sleep at night or concentrate   Irritability   Being tearful (crying) or depressed   These are common complaints, possibly related to your anesthesia, stress of surgery and change in lifestyle, that usually go away a few weeks after surgery.  If these feelings continue, call your  medical doctor.  Wound Care You may have surgical glue, steri-strips, or staples over your incisions after surgery.  Surgical glue:  Looks like a clear film over your incisions and will wear off gradually. Steri-strips: Strips of tape over your incisions. You may notice a yellowish color on the skin underneath the steri-strips. This is a substance used to make the steri-strips stick better. Do not pull the steri-strips off - let them fall off.  Staples: Cherlynn Polo may be removed before you leave the hospital. If you go home with staples, call [facility] [phone]) for an appointment with your surgeon's nurse to have staples removed in 7 - 10 days. Showering: You may shower two days after your surgery unless otherwise instructed by your surgeon. Wash gently around wounds with warm soapy water, rinse well, and gently pat dry.  If you have a drain, you may need someone to hold this while you shower. Avoid tub baths until staples are removed and incisions are healed.    Medications   Medications should be liquid or crushed if larger than the size of a dime.  Extended release pills should not be crushed.   Depending on the size and number of medications you take, you may need to stagger/change the time you take your medications so that you do not over-fill your pouch.    Make sure you follow-up with your primary care physician to make medication adjustments needed during rapid weight loss and life-style adjustment.   If  you are diabetic, follow up with the doctor that prescribes your diabetes medication(s) within one week after surgery and check your blood sugar regularly.   Do not drive while taking narcotics!   Do not take acetaminophen (Tylenol) and Roxicet or Lortab Elixir at the same time since these pain medications contain acetaminophen.  Diet at home: (First 2 Weeks) You will see the nutritionist two weeks after your surgery. She will advance your diet if you are tolerating  liquids well. Once at home, if you have severe vomiting or nausea and cannot tolerate clear liquids lasting longer than 1 day, call your surgeon.  Begin high protein shake 2 ounces every 3 hours, 5 - 6 times per day.  Gradually increase the amount you drink as tolerated.  You may find it easier to slowly sip shakes throughout the day.  It is important to get your proteins in first.   Protein Shake   Drink at least 2 ounces of shake 5-6 times per day   Each serving of protein shakes should have a minimum of 15 grams of protein and no more than 5 grams of carbohydrate    Increase the amount of protein shake you drink as tolerated   Protein powder may be added to fluids such as non-fat milk or Lactaid milk (limit to 20 grams added protein powder per serving   The initial goal is to drink at least 8 ounces of protein shake/drink per day (or as directed by the nutritionist). Some examples of protein shakes are [employer], [employer]illard's, E[employer] Edge HP, and Unjury. Hydration   Gradually increase the amount of water and other liquids as tolerated (See Acceptable Fluids)   Gradually increase the amount of protein shake as tolerated     Sip fluids slowly and throughout the day   May use Sugar substitutes, use sparingly (limit to 6 - 8 packets per day). Your fluid goal is 64 ounces of fluid daily. It may take a few weeks to build up to this.         32 oz (or more) should be clear liquids and 32 oz (or more) should be full liquids.         Liquids should not contain sugar, caffeine, or carbonation! Acceptable Fluids Clear Liquids:   Water or Sugar-free flavored water, Fruit H2O   Decaffeinated coffee or tea (sugar-free)   Crystal Lite, Wyler's Lite, Minute Maid Lite   Sugar-free Jell-O   Bouillon or broth   Sugar-free Popsicle:   *Less than 20 calories each; Limit 1 per day   Full Liquids:              Protein Shakes/Drinks + 2 choices per day of other full liquids shown below.    Other full  liquids must be: No more than 12 grams of Carbs per serving,  No more than 3 grams of Fat per serving   Strained low-fat cream soup   Non-Fat milk   Fat-free Lactaid Milk   Sugar-free yogurt (Dannon Lite & Fit) Vitamins and Minerals (Start 1 day after surgery unless otherwise directed)   2 Chewable Multivitamin / Multimineral Supplement (i.e. Centrum for Adults)   Chewable Calcium Citrate with Vitamin D-3. Take 1500 mg each day.           (Example: 3 Chewable Calcium Plus 600 with Vitamin D-3 can be found at [facility])         Vitamin B-12, 350 - 500 micrograms (oral tablet) each day  Do not mix multivitamins containing iron with calcium supplements; take 2 hours   apart   Do not substitute Tums (calcium carbonate) for your calcium   Menstruating women and those at risk for anemia may need extra iron. Talk with your doctor to see if you need additional iron.    If you need extra iron:  Total daily Iron recommendations (including Vitamins) = 50 - 100 mg Iron/day Do not stop taking or change any vitamins or minerals until you talk to your nutritionist or surgeon. Your nutritionist and / or physician must approve all vitamin and mineral supplements. Exercise For maximum success, begin exercising as soon as your doctor recommends. Make sure your physician approves any physical activity.   Depending on fitness level, begin with a simple walking program   Walk 5-15 minutes each day, 7 days per week.    Slowly increase until you are walking 30-45 minutes per day   Consider joining our BELT program. [phone] or email [email or web address] Things to remember:    You may have sexual relations when you feel comfortable. It is VERY important for female patients to use a reliable birth control method. Fertility often increases after surgery. Do not get pregnant for at least 18 months.   It is very important to keep all follow up appointments with your surgeon, nutritionist, primary care physician, and  behavioral health practitioner. After the first year, please follow up with your bariatric surgeon at least once a year in order to maintain best weight loss results.  [facility]: [phone] [facility]: [phone]   Free counseling is available for you and your family through collaboration between [facility] and [place]. Please call [phone] and leave a message.    Consider purchasing a medical alert bracelet that says you had gastric bypass surgery.    The [facility] has a free Bariatric Surgery Support Group that meets monthly, the 3rd Thursday, 6 pm, Classroom #1, [employer]. You may register online at [email or web address], but registration is not necessary. Select Classes and Support Groups, Bariatric Surgery, or Call [phone]   Do not return to work or drive until cleared by your surgeon   Use your CPAP when sleeping if applicable   Do not lift anything greater than ten pounds for at least two weeks  Talmadge Chad, RN BAriatric Nurse Coordinator

## 2012-01-05 NOTE — Progress Notes (Signed)
2 Days Post-Op  Subjective: Abdominal pain continues to improve.  Tolerating liquids but c/o HA  Objective: Vital signs in last 24 hours: Temp:  [98.4 F (36.9 C)-99.5 F (37.5 C)] 99 F (37.2 C) (08/29 0536) Pulse Rate:  [82-97] 82  (08/29 0536) Resp:  [17-18] 18  (08/29 0536) BP: (124-148)/(74-89) 124/76 mmHg (08/29 0536) SpO2:  [92 %-97 %] 92 % (08/29 0536) Last BM Date: 01/03/12  Intake/Output from previous day: 08/28 0701 - 08/29 0700 In: 1100 [I.V.:1100] Out: 3150 [Urine:3100; Drains:50] Intake/Output this shift:    General appearance: alert, cooperative and no distress Resp: nonlabored Cardio: normal rate, regular GI: soft, appropriate incisional tenderness, ND, wounds without infection, JP SS  Lab Results:   [facility] 01/05/12 0350 01/04/12 0355  WBC 7.6 9.1  HGB 11.5* 11.2*  HCT 34.2* 33.6*  PLT 254 263   BMET  Basename 01/04/12 0355  NA 135  K 4.0  CL 101  CO2 26  GLUCOSE 128*  BUN 9  CREATININE 0.65  CALCIUM 9.0   PT/INR No results found for this basename: LABPROT:2,INR:2 in the last 72 hours ABG No results found for this basename: PHART:2,PCO2:2,PO2:2,HCO3:2 in the last 72 hours  Studies/Results: Dg Ugi W/water Sol Cm  01/04/2012  *RADIOLOGY REPORT*  Clinical Data:  Status post gastric bypass surgery.  UPPER GI SERIES WITH KUB  Technique:  Routine upper GI series was performed with water soluble contrast.  Fluoroscopy Time: 1.13 minutes  Comparison:  Preoperative study 07/26/2011.  Findings: The distal esophagus appears normal.  There is a small residual gastric pouch.  Contrast medium enters the small bowel through the anastomoses.  No findings for leak/extravasation.  The small bowel loops are normal.  IMPRESSION: Normal postoperative appearance status post gastric bypass surgery without complicating features.   Original Report Authenticated By: P. Loralie Champagne, M.D.     Anti-infectives: Anti-infectives     Start     Dose/Rate Route Frequency  Ordered Stop   01/03/12 0600   ertapenem (INVANZ) 1 g in sodium chloride 0.9 % 50 mL IVPB        1 g 100 mL/hr over 30 Minutes Intravenous 60 min pre-op 01/03/12 0534 01/03/12 0733          Assessment/Plan: s/p Procedure(s) (LRB): LAPAROSCOPIC ROUX-EN-Y GASTRIC BYPASS WITH UPPER ENDOSCOPY (N/A) UPPER GI ENDOSCOPY () bariatric diet as tolerated, no apparent complications.  should be okay for discharge later today.  LOS: 2 days    Lodema Pilot DAVID 01/05/2012

## 2012-01-05 NOTE — Progress Notes (Signed)
Patient provided with discharge instructions and prescriptions. Patient verbalized understanding. Patient discharged to home. 

## 2012-01-05 NOTE — Progress Notes (Signed)
Continues to feel better.  Tolerating liquids without nausea pain controlled.  She feels ready for discharge.

## 2012-01-16 NOTE — Discharge Summary (Signed)
Physician Discharge Summary  Patient ID: Carolyn Morrison MRN: [id number] DOB/AGE: Jun 20, 1960 51 y.o.  Admit date: 01/03/2012 Discharge date: 01/16/2012  Admission Diagnoses: morbid obesity  Discharge Diagnoses: same Active Problems:  * No active hospital problems. *    Discharged Condition: stable  Hospital Course: to OR 01/03/12 for lap Roux Y Gastric Bypass.  No apparent complications.  UGI on POD 1 negative for leak.  Diet was advanced and she tolerated this without difficulty.  On POD 2 she was HD stable and tolerating diet.  She was ambulatory, though with her baseline difficulties.  Was ready for discharge to home.  Consults: None  Significant Diagnostic Studies: radiology: UGI-negative  Treatments: surgery: lap RYGB/EGD on 01/03/12  Disposition: 01-Home or Self Care  Discharge Orders    Future Appointments: Provider: Department: Dept Phone: Center:   01/17/2012 4:00 PM [facility] [phone] [facility]   01/27/2012 8:45 AM Lodema Pilot, DO [facility] [phone] None     Future Orders Please Complete By Expires   Diet - low sodium heart healthy      Increase activity slowly      Discharge instructions      Comments:   May shower tomorrow. Drink plenty of water and no calorie beverages. Call [phone] for follow up appointment with Dr. Biagio Quint in 3 weeks. May start full liquids on Saturday, then take full liquids for 1 week, then advance to pureed diet x1 week, then soft diet x1 week, and then gradually introduce regular, high protein and low carb/low fat foods as tolerated.   Call MD for:  temperature >100.4      Call MD for:  persistant nausea and vomiting      Call MD for:  severe uncontrolled pain      Call MD for:  redness, tenderness, or signs of infection (pain, swelling, redness, odor or green/yellow discharge around incision site)      Call MD for:  difficulty breathing, headache or visual disturbances      Call MD for:   persistant dizziness or light-headedness        Medication List  As of 01/16/2012 10:46 AM   STOP taking these medications         aspirin 81 MG tablet      CELEBREX 200 MG capsule      Cinnamon 500 MG capsule      fish oil-omega-3 fatty acids 1000 MG capsule      glucosamine-chondroitin 500-400 MG tablet      magnesium oxide 400 MG tablet      metaxalone 800 MG tablet      traMADol 50 MG tablet         TAKE these medications         CYMBALTA 60 MG capsule   Generic drug: DULoxetine   Take 60 mg by mouth every morning.      MICARDIS HCT 80-12.5 MG per tablet   Generic drug: telmisartan-hydrochlorothiazide   Take 1 tablet by mouth every morning.      multivitamin with minerals Tabs   Take 1 tablet by mouth daily.      NEXIUM 40 MG capsule   Generic drug: esomeprazole   Take 40 mg by mouth every morning.      oxyCODONE-acetaminophen 5-325 MG/5ML solution   Commonly known as: ROXICET   Take 5-10 mLs by mouth every 4 (four) hours as needed.      VITAMIN-B COMPLEX PO   Take by  mouth daily.      ZOVIRAX 5 %   Generic drug: acyclovir cream   Apply 1 application topically as needed. Apply to affected areas for break outs             Signed: Lodema Pilot DAVID 01/16/2012, 10:46 AM

## 2012-01-17 ENCOUNTER — Encounter: Payer: 59 | Attending: General Surgery | Admitting: *Deleted

## 2012-01-17 ENCOUNTER — Encounter: Payer: Self-pay | Admitting: *Deleted

## 2012-01-17 DIAGNOSIS — Z713 Dietary counseling and surveillance: Secondary | ICD-10-CM | POA: Insufficient documentation

## 2012-01-17 DIAGNOSIS — Z01818 Encounter for other preprocedural examination: Secondary | ICD-10-CM | POA: Insufficient documentation

## 2012-01-17 NOTE — Progress Notes (Addendum)
Bariatric Class:  Appt start time: 1600 end time:  1700.  2 Week Post-Operative Nutrition Class  Patient was seen on 01/17/2012 for Post-Operative Nutrition education at the [facility].   Surgery date: 01/03/12 Surgery type: RYGB Start weight at [facility]: 382.5 lbs  Weight today: 358.5 lbs Weight change: 24.0 lbs Total weight lost: 24.0 lbs BMI: 57.9 kg/m^2  TANITA  BODY COMP RESULTS  01/17/12   %Fat 56.6%   Fat Mass (lbs) 203.0   Fat Free Mass (lbs) 155.5   Total Body Water (lbs) 114.0   The following the learning objective met the patient during this course:  Identifies Phase 3A (Soft, High Proteins) Dietary Goals and will begin from 2 weeks post-operatively to 2 months post-operatively  Identifies appropriate sources of fluids and proteins   States protein recommendations and appropriate sources post-operatively  Identifies the need for appropriate texture modifications, mastication, and bite sizes when consuming solids  Identifies appropriate multivitamin and calcium sources post-operatively  Describes the need for physical activity post-operatively and will follow MD recommendations  States when to call healthcare provider regarding medication questions or post-operative complications  Handouts given during class include:  Phase 3A: Soft, High Protein Diet Handout  Follow-Up Plan: Patient will follow-up at [facility] : [facility] in 6 weeks for 2 months post-op nutrition visit for diet advancement per MD.

## 2012-01-17 NOTE — Patient Instructions (Signed)
Patient to follow Phase 3A-Soft, High Protein Diet and follow-up at [facility] in 6 weeks for 2 months post-op nutrition visit for diet advancement. 

## 2012-01-18 ENCOUNTER — Telehealth (INDEPENDENT_AMBULATORY_CARE_PROVIDER_SITE_OTHER): Payer: Self-pay

## 2012-01-18 NOTE — Telephone Encounter (Signed)
Patient aware of appt rescheduled to 01/27/12 @ 8:45 am

## 2012-01-19 ENCOUNTER — Encounter (INDEPENDENT_AMBULATORY_CARE_PROVIDER_SITE_OTHER): Payer: 59 | Admitting: General Surgery

## 2012-01-27 ENCOUNTER — Ambulatory Visit (INDEPENDENT_AMBULATORY_CARE_PROVIDER_SITE_OTHER): Payer: 59 | Admitting: General Surgery

## 2012-01-27 ENCOUNTER — Encounter (INDEPENDENT_AMBULATORY_CARE_PROVIDER_SITE_OTHER): Payer: Self-pay | Admitting: General Surgery

## 2012-01-27 VITALS — BP 124/68 | HR 80 | Temp 98.6°F | Resp 18 | Ht 66.0 in | Wt 350.4 lb

## 2012-01-27 DIAGNOSIS — Z4889 Encounter for other specified surgical aftercare: Secondary | ICD-10-CM

## 2012-01-27 DIAGNOSIS — Z5189 Encounter for other specified aftercare: Secondary | ICD-10-CM

## 2012-01-27 LAB — CBC WITH DIFFERENTIAL/PLATELET
Basophils Relative: 0 % (ref 0–1)
Eosinophils Absolute: 0.4 10*3/uL (ref 0.0–0.7)
Hemoglobin: 14 g/dL (ref 12.0–15.0)
MCH: 29.9 pg (ref 26.0–34.0)
MCHC: 34.5 g/dL (ref 30.0–36.0)
Monocytes Absolute: 0.9 10*3/uL (ref 0.1–1.0)
Monocytes Relative: 12 % (ref 3–12)
Neutrophils Relative %: 59 % (ref 43–77)

## 2012-01-27 LAB — IRON AND TIBC
%SAT: 24 % (ref 20–55)
TIBC: 265 ug/dL (ref 250–470)
UIBC: 202 ug/dL (ref 125–400)

## 2012-01-27 LAB — COMPREHENSIVE METABOLIC PANEL
Alkaline Phosphatase: 73 U/L (ref 39–117)
BUN: 10 mg/dL (ref 6–23)
Creat: 0.75 mg/dL (ref 0.50–1.10)
Glucose, Bld: 96 mg/dL (ref 70–99)
Sodium: 138 mEq/L (ref 135–145)
Total Bilirubin: 0.6 mg/dL (ref 0.3–1.2)

## 2012-01-27 NOTE — Progress Notes (Signed)
Subjective:     Patient ID: Carolyn Morrison, female   DOB: 05/19/1960, 51 y.o.   MRN: [id number]  HPI This patient follows up in month status post laparoscopic Roux-en-Y gastric bypass. She says that she does not have much energy but she is  taking her vitamins and protein supplements. She has no abdominal pain or other complaints. She has no food intolerances to this point. She did have one episode of vomiting which was after eating too much pork chop. Her activity is still limited due to her arthritis and baseline musculoskeletal issues.  Review of Systems     Objective:   Physical Exam Wt Readings from Last 3 Encounters:  01/27/12 350 lb 6.4 oz (158.94 kg)  01/17/12 358 lb 8 oz (162.615 kg)  01/03/12 375 lb 2 oz (170.156 kg)   Temp Readings from Last 3 Encounters:  01/27/12 98.6 F (37 C) Temporal  01/05/12 98.2 F (36.8 C) Oral  01/05/12 98.2 F (36.8 C) Oral   BP Readings from Last 3 Encounters:  01/27/12 124/68  01/05/12 122/74  01/05/12 122/74   Pulse Readings from Last 3 Encounters:  01/27/12 80  01/05/12 84  01/05/12 84    No distress and nontoxic-appearing Her abdomen is soft and nontender and exam and her incisions are healing well without sign of infection.    Assessment:     Status post Roux-en-Y gastric bypass-doing well She seems to be doing very well from her procedure and has usual complaints of lack of energy. We will go ahead and check some baseline laboratory studies and I recommended that she continue with her protein supplements and vitamin intake. Also recommended that is if she can, she increase her physical activity in order to maximize her weight loss. Again explained to her that my biggest concern for her is going to be insufficiently loss given her lack of activity and no most concerned with some of the food that she is taking in.    Plan:     We will check some Baseline labs and I will see her back in 2 months for repeat evaluation.

## 2012-02-09 ENCOUNTER — Emergency Department (HOSPITAL_COMMUNITY): Payer: 59

## 2012-02-09 ENCOUNTER — Encounter (HOSPITAL_COMMUNITY): Payer: Self-pay | Admitting: Emergency Medicine

## 2012-02-09 ENCOUNTER — Emergency Department (HOSPITAL_COMMUNITY)
Admission: EM | Admit: 2012-02-09 | Discharge: 2012-02-09 | Disposition: A | Discharge status: Home / Self Care | Payer: 59 | Attending: Emergency Medicine | Admitting: Emergency Medicine

## 2012-02-09 DIAGNOSIS — R112 Nausea with vomiting, unspecified: Secondary | ICD-10-CM | POA: Insufficient documentation

## 2012-02-09 DIAGNOSIS — I10 Essential (primary) hypertension: Secondary | ICD-10-CM | POA: Insufficient documentation

## 2012-02-09 DIAGNOSIS — Z9089 Acquired absence of other organs: Secondary | ICD-10-CM | POA: Insufficient documentation

## 2012-02-09 DIAGNOSIS — E669 Obesity, unspecified: Secondary | ICD-10-CM | POA: Insufficient documentation

## 2012-02-09 DIAGNOSIS — N390 Urinary tract infection, site not specified: Secondary | ICD-10-CM | POA: Insufficient documentation

## 2012-02-09 LAB — COMPREHENSIVE METABOLIC PANEL
Albumin: 3.8 g/dL (ref 3.5–5.2)
BUN: 8 mg/dL (ref 6–23)
Creatinine, Ser: 0.73 mg/dL (ref 0.50–1.10)
Total Protein: 7.2 g/dL (ref 6.0–8.3)

## 2012-02-09 LAB — CBC WITH DIFFERENTIAL/PLATELET
Eosinophils Relative: 3 % (ref 0–5)
HCT: 41.5 % (ref 36.0–46.0)
Lymphocytes Relative: 21 % (ref 12–46)
Lymphs Abs: 1.6 10*3/uL (ref 0.7–4.0)
MCV: 85.6 fL (ref 78.0–100.0)
Monocytes Absolute: 0.7 10*3/uL (ref 0.1–1.0)
Platelets: 233 10*3/uL (ref 150–400)
RBC: 4.85 MIL/uL (ref 3.87–5.11)
WBC: 7.5 10*3/uL (ref 4.0–10.5)

## 2012-02-09 LAB — LIPASE, BLOOD: Lipase: 11 U/L (ref 11–59)

## 2012-02-09 LAB — URINALYSIS, ROUTINE W REFLEX MICROSCOPIC
Glucose, UA: NEGATIVE mg/dL
Nitrite: NEGATIVE
Protein, ur: 30 mg/dL — AB

## 2012-02-09 LAB — GLUCOSE, CAPILLARY

## 2012-02-09 LAB — URINE MICROSCOPIC-ADD ON

## 2012-02-09 LAB — PREGNANCY, URINE: Preg Test, Ur: NEGATIVE

## 2012-02-09 MED ORDER — GI COCKTAIL ~~LOC~~
30.0000 mL | Freq: Once | ORAL | Status: AC
  Administered 2012-02-09: 30 mL via ORAL
  Filled 2012-02-09: qty 30

## 2012-02-09 MED ORDER — KCL IN DEXTROSE-NACL 20-5-0.45 MEQ/L-%-% IV SOLN
Freq: Once | INTRAVENOUS | Status: AC
  Administered 2012-02-09: 15:00:00 via INTRAVENOUS
  Filled 2012-02-09: qty 1000

## 2012-02-09 MED ORDER — PROMETHAZINE HCL 25 MG PO TABS: 25.0000 mg | ORAL_TABLET | Freq: Four times a day (QID) | ORAL | Status: DC | PRN

## 2012-02-09 MED ORDER — METOCLOPRAMIDE HCL 5 MG/ML IJ SOLN
10.0000 mg | Freq: Once | INTRAMUSCULAR | Status: AC
  Administered 2012-02-09: 10 mg via INTRAVENOUS
  Filled 2012-02-09: qty 2

## 2012-02-09 MED ORDER — POTASSIUM CHLORIDE 20 MEQ/15ML (10%) PO LIQD
40.0000 meq | Freq: Once | ORAL | Status: AC
  Administered 2012-02-09: 40 meq via ORAL
  Filled 2012-02-09: qty 30

## 2012-02-09 MED ORDER — POTASSIUM CHLORIDE 20 MEQ PO PACK: 40.0000 meq | PACK | Freq: Once | ORAL | Status: DC

## 2012-02-09 MED ORDER — DIPHENHYDRAMINE HCL 50 MG/ML IJ SOLN
25.0000 mg | Freq: Once | INTRAMUSCULAR | Status: AC
  Administered 2012-02-09: 25 mg via INTRAVENOUS
  Filled 2012-02-09: qty 1

## 2012-02-09 MED ORDER — FAMOTIDINE IN NACL 20-0.9 MG/50ML-% IV SOLN
20.0000 mg | Freq: Once | INTRAVENOUS | Status: AC
  Administered 2012-02-09: 20 mg via INTRAVENOUS
  Filled 2012-02-09: qty 50

## 2012-02-09 MED ORDER — ONDANSETRON HCL 4 MG/2ML IJ SOLN
4.0000 mg | Freq: Once | INTRAMUSCULAR | Status: AC
Start: 2012-02-09 — End: 2012-02-09
  Administered 2012-02-09: 4 mg via INTRAVENOUS
  Filled 2012-02-09: qty 2

## 2012-02-09 MED ORDER — SODIUM CHLORIDE 0.9 % IV BOLUS (SEPSIS)
1000.0000 mL | Freq: Once | INTRAVENOUS | Status: AC
  Administered 2012-02-09: 1000 mL via INTRAVENOUS

## 2012-02-09 MED ORDER — SULFAMETHOXAZOLE-TRIMETHOPRIM 800-160 MG PO TABS: 1.0000 | ORAL_TABLET | Freq: Two times a day (BID) | ORAL | Status: DC

## 2012-02-09 MED ORDER — ONDANSETRON HCL 4 MG/2ML IJ SOLN
4.0000 mg | Freq: Once | INTRAMUSCULAR | Status: AC
  Administered 2012-02-09: 4 mg via INTRAVENOUS
  Filled 2012-02-09: qty 2

## 2012-02-09 NOTE — ED Notes (Signed)
Pt states that she is too nauseous to take the PO Potassium solution.

## 2012-02-09 NOTE — ED Notes (Signed)
Per EMS: pt is c/o of nausea for the past few days.  Today was the first day the pt returned to work since undergoing gastric bypass surgery.  Per EMS: pt has not taken any of the nausea medication she was prescribed since surgery.  Vital signs per EMS: BP 160/90 HR 72 RR 24

## 2012-02-09 NOTE — ED Notes (Signed)
[id number]<JY> Expected date:<BR> Expected time:10:06 AM<BR> Means of arrival:Ambulance<BR> Comments:<BR> 51yoF n/v, recent surgery

## 2012-02-09 NOTE — ED Provider Notes (Signed)
Medical screening examination/treatment/procedure(s) were performed by non-physician practitioner and as supervising physician I was immediately available for consultation/collaboration.  Gerhard Munch, MD 02/09/12 1601

## 2012-02-09 NOTE — ED Notes (Signed)
Pt states for the past week she's had nausea w/ dry heaves, pt denies diarrhea, states had some when she first had the gastric bypass the end of august but none since, pt states also having some numbness in hands.

## 2012-02-09 NOTE — ED Provider Notes (Signed)
History     CSN: [id number]  Arrival date & time 02/09/12  1007   First MD Initiated Contact with Patient 02/09/12 1012      Chief Complaint  Patient presents with  . Nausea    (Consider location/radiation/quality/duration/timing/severity/associated sxs/prior treatment) The history is provided by the patient, medical records and a friend.    EFFA YARROW is a 51 y.o. female presents to the emergency department complaining of nausea and vomiting  The onset of the symptoms was  gradual starting 1 week ago.  The patient has associated weakness and fatigue.  The symptoms have been  intermittent, gradually worsened.  Nothing makes the symptoms worse and nothing makes symptoms better.  The patient denies fever, chills, headache, neck pain, chest pain, shortness of breath, abdominal pain, diarrhea, constipation, numbness, tingling, syncope.  Patient states that on Aug 27th she had gastric bypass by Dr [facility], surgery.  States she's not had any problems since the surgery until one week ago.  She called the Dr yesterday and she currently has an appt for f/u tomorrow.  She also states that her "arms went numb" about 1 hr ago, but that has resolved.  NBNB emesis.  Pt last keep PO down on Tuesday.  She has not been taking the anti-nausea medication prescribed by the surgeon.    Past Medical History  Diagnosis Date  . GERD (gastroesophageal reflux disease)   . Arthritis   . Hypertension   . Hyperlipidemia     Past Surgical History  Procedure Date  . Cesarean section 1992  . Cholecystectomy 2001  . Facial reconstruction surgery 1978  . Appendectomy 1976  . Wisdom tooth extraction   . Gastric roux-en-y 01/03/2012    Procedure: LAPAROSCOPIC ROUX-EN-Y GASTRIC BYPASS WITH UPPER ENDOSCOPY;  Surgeon: Lodema Pilot, DO;  Location: [facility];  Service: General;  Laterality: N/A;    Family History  Problem Relation Age of Onset  . Diabetes Maternal Aunt     History  Substance Use Topics    . Smoking status: Former Smoker -- 0.2 packs/day    Quit date: 10/13/2011  . Smokeless tobacco: Never Used  . Alcohol Use: Yes     occasional    OB History    Grav Para Term Preterm Abortions TAB SAB Ect Mult Living                  Review of Systems  Constitutional: Positive for fatigue. Negative for fever, diaphoresis, appetite change and unexpected weight change.  HENT: Negative for mouth sores, trouble swallowing, neck pain and neck stiffness.   Respiratory: Negative for cough, chest tightness, shortness of breath, wheezing and stridor.   Cardiovascular: Negative for chest pain and palpitations.  Gastrointestinal: Positive for nausea and vomiting. Negative for abdominal pain, diarrhea, constipation, blood in stool, abdominal distention and rectal pain.  Genitourinary: Negative for dysuria, urgency, frequency, hematuria, flank pain and difficulty urinating.  Musculoskeletal: Negative for back pain.  Skin: Negative for rash.  Neurological: Positive for numbness (bilateral hands). Negative for weakness.  Hematological: Negative for adenopathy.  Psychiatric/Behavioral: Negative for confusion.  All other systems reviewed and are negative.    Allergies  Review of patient's allergies indicates no known allergies.  Home Medications   Current Outpatient Rx  Name Route Sig Dispense Refill  . CELEBREX 200 MG PO CAPS Oral Take 200 mg by mouth daily.     . CYMBALTA 60 MG PO CPEP Oral Take 60 mg by mouth every morning.     Marland Kitchen  MICARDIS HCT 80-12.5 MG PO TABS Oral Take 1 tablet by mouth every morning.     . ADULT MULTIVITAMIN W/MINERALS CH Oral Take 1 tablet by mouth daily.    Marland Kitchen NEXIUM 40 MG PO CPDR Oral Take 40 mg by mouth every morning.     Marland Kitchen TRAMADOL HCL 50 MG PO TABS  Ad lib.    Marland Kitchen VITAMIN-B COMPLEX PO Oral Take by mouth daily.     . OXYCODONE-ACETAMINOPHEN 5-325 MG/5ML PO SOLN Oral Take 5-10 mLs by mouth every 4 (four) hours as needed. 500 mL 0    BP 96/45  Pulse 70  Temp  98.7 F (37.1 C) (Oral)  Resp 20  SpO2 96%  Physical Exam  Nursing note and vitals reviewed. Constitutional: She is oriented to person, place, and time. She appears well-developed and well-nourished.  HENT:  Head: Normocephalic and atraumatic.  Mouth/Throat: Oropharynx is clear and moist.  Eyes: Conjunctivae normal are normal. Pupils are equal, round, and reactive to light. No scleral icterus.  Neck: Normal range of motion.  Cardiovascular: Normal rate, regular rhythm, normal heart sounds and intact distal pulses.  Exam reveals no gallop and no friction rub.   No murmur heard. Pulmonary/Chest: Effort normal and breath sounds normal.  Abdominal: Soft. Bowel sounds are normal. She exhibits no distension and no mass. There is no tenderness. There is no rigidity, no rebound, no guarding, no CVA tenderness, no tenderness at McBurney's point and negative Murphy's sign.       Obese body habitus Well healing surgical scars from gastric bypass, non-erythematous, non-draining   Musculoskeletal: Normal range of motion. She exhibits no edema and no tenderness.  Lymphadenopathy:    She has no cervical adenopathy.  Neurological: She is alert and oriented to person, place, and time. She exhibits normal muscle tone. Coordination normal.  Skin: Skin is warm and dry. No rash noted.  Psychiatric: She has a normal mood and affect.    ED Course  Procedures (including critical care time)  Labs Reviewed  COMPREHENSIVE METABOLIC PANEL - Abnormal; Notable for the following:    Potassium 2.9 (*)     Glucose, Bld 105 (*)     All other components within normal limits  GLUCOSE, CAPILLARY - Abnormal; Notable for the following:    Glucose-Capillary 107 (*)     All other components within normal limits  URINALYSIS, ROUTINE W REFLEX MICROSCOPIC - Abnormal; Notable for the following:    Color, Urine AMBER (*)  BIOCHEMICALS MAY BE AFFECTED BY COLOR   APPearance CLOUDY (*)     Bilirubin Urine MODERATE (*)      Ketones, ur >80 (*)     Protein, ur 30 (*)     Leukocytes, UA SMALL (*)     All other components within normal limits  URINE MICROSCOPIC-ADD ON - Abnormal; Notable for the following:    Squamous Epithelial / LPF FEW (*)     Bacteria, UA MANY (*)     Casts HYALINE CASTS (*)     All other components within normal limits  CBC WITH DIFFERENTIAL  LIPASE, BLOOD  PREGNANCY, URINE   Dg Abd Acute W/chest  02/09/2012  *RADIOLOGY REPORT*  Clinical Data: Shortness of breath, coughing, nausea, vomiting, evaluate for obstruction  ACUTE ABDOMEN SERIES (ABDOMEN 2 VIEW & CHEST 1 VIEW)  Comparison: None.  Findings:  Normal cardiac silhouette and mediastinal contours. There is mild diffuse thickening of the pulmonary interstitium.  No focal airspace opacities.  No pleural effusion or pneumothorax.  Paucity of bowel gas without evidence of obstruction.  Surgical clips overlying the expected location of the gastric fundus. Additional surgical staple line overlies the left mid hemiabdomen. No pneumoperitoneum, pneumatosis or portal venous gas.  Post cholecystectomy.  No definite abnormal intra-abdominal calcifications.  An IUD overlies the pelvis.  Lumbar spine degenerative change. Severe left sided hip degenerative change.  IMPRESSION:  1.  Postsurgical change of the abdomen without definite evidence of obstruction.  2.  Findings suggestive of airways disease.  No focal airspace opacities to suggest pneumonia.  3.  Severe left sided hip degenerative change.   Original Report Authenticated By: Waynard Reeds, M.D.     Results for orders placed during the hospital encounter of 02/09/12  COMPREHENSIVE METABOLIC PANEL      Component Value Range   Sodium 136  135 - 145 mEq/L   Potassium 2.9 (*) 3.5 - 5.1 mEq/L   Chloride 98  96 - 112 mEq/L   CO2 23  19 - 32 mEq/L   Glucose, Bld 105 (*) 70 - 99 mg/dL   BUN 8  6 - 23 mg/dL   Creatinine, Ser 1.61  0.50 - 1.10 mg/dL   Calcium 9.8  8.4 - 09.6 mg/dL   Total Protein 7.2   6.0 - 8.3 g/dL   Albumin 3.8  3.5 - 5.2 g/dL   AST 15  0 - 37 U/L   ALT 13  0 - 35 U/L   Alkaline Phosphatase 88  39 - 117 U/L   Total Bilirubin 0.5  0.3 - 1.2 mg/dL   GFR calc non Af Amer >90  >90 mL/min   GFR calc Af Amer >90  >90 mL/min  CBC WITH DIFFERENTIAL      Component Value Range   WBC 7.5  4.0 - 10.5 K/uL   RBC 4.85  3.87 - 5.11 MIL/uL   Hemoglobin 14.1  12.0 - 15.0 g/dL   HCT 04.5  40.9 - 81.1 %   MCV 85.6  78.0 - 100.0 fL   MCH 29.1  26.0 - 34.0 pg   MCHC 34.0  30.0 - 36.0 g/dL   RDW 91.4  78.2 - 95.6 %   Platelets 233  150 - 400 K/uL   Neutrophils Relative 67  43 - 77 %   Neutro Abs 5.0  1.7 - 7.7 K/uL   Lymphocytes Relative 21  12 - 46 %   Lymphs Abs 1.6  0.7 - 4.0 K/uL   Monocytes Relative 9  3 - 12 %   Monocytes Absolute 0.7  0.1 - 1.0 K/uL   Eosinophils Relative 3  0 - 5 %   Eosinophils Absolute 0.2  0.0 - 0.7 K/uL   Basophils Relative 1  0 - 1 %   Basophils Absolute 0.0  0.0 - 0.1 K/uL  LIPASE, BLOOD      Component Value Range   Lipase 11  11 - 59 U/L  GLUCOSE, CAPILLARY      Component Value Range   Glucose-Capillary 107 (*) 70 - 99 mg/dL   Comment 1 Notify RN    URINALYSIS, ROUTINE W REFLEX MICROSCOPIC      Component Value Range   Color, Urine AMBER (*) YELLOW   APPearance CLOUDY (*) CLEAR   Specific Gravity, Urine 1.025  1.005 - 1.030   pH 7.5  5.0 - 8.0   Glucose, UA NEGATIVE  NEGATIVE mg/dL   Hgb urine dipstick NEGATIVE  NEGATIVE   Bilirubin Urine MODERATE (*) NEGATIVE  Ketones, ur >80 (*) NEGATIVE mg/dL   Protein, ur 30 (*) NEGATIVE mg/dL   Urobilinogen, UA 1.0  0.0 - 1.0 mg/dL   Nitrite NEGATIVE  NEGATIVE   Leukocytes, UA SMALL (*) NEGATIVE  PREGNANCY, URINE      Component Value Range   Preg Test, Ur NEGATIVE  NEGATIVE  URINE MICROSCOPIC-ADD ON      Component Value Range   Squamous Epithelial / LPF FEW (*) RARE   WBC, UA 3-6  <3 WBC/hpf   RBC / HPF 0-2  <3 RBC/hpf   Bacteria, UA MANY (*) RARE   Casts HYALINE CASTS (*) NEGATIVE    Urine-Other MUCOUS PRESENT     Dg Abd Acute W/chest  02/09/2012  *RADIOLOGY REPORT*  Clinical Data: Shortness of breath, coughing, nausea, vomiting, evaluate for obstruction  ACUTE ABDOMEN SERIES (ABDOMEN 2 VIEW & CHEST 1 VIEW)  Comparison: None.  Findings:  Normal cardiac silhouette and mediastinal contours. There is mild diffuse thickening of the pulmonary interstitium.  No focal airspace opacities.  No pleural effusion or pneumothorax.  Paucity of bowel gas without evidence of obstruction.  Surgical clips overlying the expected location of the gastric fundus. Additional surgical staple line overlies the left mid hemiabdomen. No pneumoperitoneum, pneumatosis or portal venous gas.  Post cholecystectomy.  No definite abnormal intra-abdominal calcifications.  An IUD overlies the pelvis.  Lumbar spine degenerative change. Severe left sided hip degenerative change.  IMPRESSION:  1.  Postsurgical change of the abdomen without definite evidence of obstruction.  2.  Findings suggestive of airways disease.  No focal airspace opacities to suggest pneumonia.  3.  Severe left sided hip degenerative change.   Original Report Authenticated By: Waynard Reeds, M.D.     1. UTI (lower urinary tract infection)   2. Nausea and vomiting in adult       MDM  Marylu Lund L Petta nausea and vomiting s/p gastric bypass.  Glucose 107, CBC unremarkable, lipase within normal limits, CMP with hypokalemia, urine pregnancy negative urinalysis with evidence of urinary tract infection.   Acute abdominal series with no evidence of obstruction.  Hypokalemia treated with oral solution here in the emergency department.  Patient given GI cocktail, Zofran, Reglan, protonic and D5 half normal saline here in the emergency department with complete resolution of her nausea and vomiting.  Patient states she feels much better.  She heart he has a followup appointment scheduled with her surgeon for tomorrow morning. I've asked her to keep this  appointment for further evaluation.  We'll send her home with Phenergan for vomiting and bactrim.  I discussed these findings with the patient.  I have also discussed reasons to return immediately to the ER.  Patient expresses understanding and agrees with plan.  1. Medications: Phenergan, Bactrim 2. Treatment: Rest, drink plenty of fluids, take medication as prescribed, take Phenergan as needed for nausea and vomiting 3. Follow Up: Followup with Dr. Biagio Quint scheduled appointment for tomorrow.         Dahlia Client Jovanka Westgate, PA-C 02/09/12 1554

## 2012-02-10 ENCOUNTER — Inpatient Hospital Stay (HOSPITAL_COMMUNITY)
Admission: AD | Admit: 2012-02-10 | Discharge: 2012-02-14 | DRG: 392 | Disposition: A | Discharge status: Home / Self Care | Payer: 59 | Source: Ambulatory Visit | Attending: General Surgery | Admitting: General Surgery

## 2012-02-10 ENCOUNTER — Ambulatory Visit (INDEPENDENT_AMBULATORY_CARE_PROVIDER_SITE_OTHER): Payer: 59 | Admitting: General Surgery

## 2012-02-10 ENCOUNTER — Inpatient Hospital Stay (HOSPITAL_COMMUNITY): Payer: 59

## 2012-02-10 ENCOUNTER — Encounter (INDEPENDENT_AMBULATORY_CARE_PROVIDER_SITE_OTHER): Payer: Self-pay | Admitting: General Surgery

## 2012-02-10 ENCOUNTER — Encounter (HOSPITAL_COMMUNITY): Payer: Self-pay

## 2012-02-10 VITALS — BP 140/76 | HR 62 | Temp 98.1°F | Resp 18

## 2012-02-10 DIAGNOSIS — E876 Hypokalemia: Secondary | ICD-10-CM | POA: Diagnosis present

## 2012-02-10 DIAGNOSIS — R110 Nausea: Secondary | ICD-10-CM

## 2012-02-10 DIAGNOSIS — K289 Gastrojejunal ulcer, unspecified as acute or chronic, without hemorrhage or perforation: Secondary | ICD-10-CM

## 2012-02-10 DIAGNOSIS — Z87891 Personal history of nicotine dependence: Secondary | ICD-10-CM

## 2012-02-10 DIAGNOSIS — E669 Obesity, unspecified: Secondary | ICD-10-CM | POA: Diagnosis present

## 2012-02-10 DIAGNOSIS — E785 Hyperlipidemia, unspecified: Secondary | ICD-10-CM | POA: Diagnosis present

## 2012-02-10 DIAGNOSIS — Z833 Family history of diabetes mellitus: Secondary | ICD-10-CM

## 2012-02-10 DIAGNOSIS — Z9884 Bariatric surgery status: Secondary | ICD-10-CM

## 2012-02-10 DIAGNOSIS — K219 Gastro-esophageal reflux disease without esophagitis: Secondary | ICD-10-CM | POA: Diagnosis present

## 2012-02-10 DIAGNOSIS — E860 Dehydration: Secondary | ICD-10-CM | POA: Diagnosis present

## 2012-02-10 DIAGNOSIS — R1115 Cyclical vomiting syndrome unrelated to migraine: Secondary | ICD-10-CM

## 2012-02-10 DIAGNOSIS — Z6841 Body Mass Index (BMI) 40.0 and over, adult: Secondary | ICD-10-CM

## 2012-02-10 DIAGNOSIS — I10 Essential (primary) hypertension: Secondary | ICD-10-CM | POA: Diagnosis present

## 2012-02-10 DIAGNOSIS — R112 Nausea with vomiting, unspecified: Principal | ICD-10-CM

## 2012-02-10 DIAGNOSIS — Z9089 Acquired absence of other organs: Secondary | ICD-10-CM

## 2012-02-10 LAB — COMPREHENSIVE METABOLIC PANEL
AST: 13 U/L (ref 0–37)
Albumin: 3.5 g/dL (ref 3.5–5.2)
BUN: 5 mg/dL — ABNORMAL LOW (ref 6–23)
Calcium: 9.3 mg/dL (ref 8.4–10.5)
Chloride: 104 mEq/L (ref 96–112)
Creatinine, Ser: 0.82 mg/dL (ref 0.50–1.10)
Total Bilirubin: 0.4 mg/dL (ref 0.3–1.2)
Total Protein: 6.5 g/dL (ref 6.0–8.3)

## 2012-02-10 LAB — CBC WITH DIFFERENTIAL/PLATELET
Basophils Absolute: 0 10*3/uL (ref 0.0–0.1)
Basophils Relative: 0 % (ref 0–1)
Eosinophils Absolute: 0.1 10*3/uL (ref 0.0–0.7)
HCT: 39 % (ref 36.0–46.0)
MCH: 29.5 pg (ref 26.0–34.0)
MCHC: 34.1 g/dL (ref 30.0–36.0)
Monocytes Absolute: 0.6 10*3/uL (ref 0.1–1.0)
Monocytes Relative: 9 % (ref 3–12)
Neutro Abs: 5 10*3/uL (ref 1.7–7.7)
RDW: 14.8 % (ref 11.5–15.5)

## 2012-02-10 LAB — URINALYSIS, ROUTINE W REFLEX MICROSCOPIC
Glucose, UA: NEGATIVE mg/dL
Hgb urine dipstick: NEGATIVE
Leukocytes, UA: NEGATIVE
Protein, ur: NEGATIVE mg/dL
Specific Gravity, Urine: 1.012 (ref 1.005–1.030)
pH: 7.5 (ref 5.0–8.0)

## 2012-02-10 LAB — VITAMIN B12: Vitamin B-12: 807 pg/mL (ref 211–911)

## 2012-02-10 MED ORDER — ENOXAPARIN SODIUM 40 MG/0.4ML ~~LOC~~ SOLN
40.0000 mg | SUBCUTANEOUS | Status: DC
  Administered 2012-02-10 – 2012-02-11 (×2): 40 mg via SUBCUTANEOUS
  Filled 2012-02-10 (×3): qty 0.4

## 2012-02-10 MED ORDER — INFLUENZA VIRUS VACC SPLIT PF IM SUSP: 0.5000 mL | INTRAMUSCULAR | Status: DC | PRN

## 2012-02-10 MED ORDER — KCL IN DEXTROSE-NACL 20-5-0.45 MEQ/L-%-% IV SOLN
INTRAVENOUS | Status: DC
  Filled 2012-02-10: qty 1000

## 2012-02-10 MED ORDER — SUCRALFATE 1 GM/10ML PO SUSP
1.0000 g | Freq: Three times a day (TID) | ORAL | Status: DC
  Administered 2012-02-10 – 2012-02-14 (×16): 1 g via ORAL
  Filled 2012-02-10 (×20): qty 10

## 2012-02-10 MED ORDER — CIPROFLOXACIN IN D5W 400 MG/200ML IV SOLN: 400.0000 mg | Freq: Two times a day (BID) | INTRAVENOUS | Status: DC

## 2012-02-10 MED ORDER — PANTOPRAZOLE SODIUM 40 MG IV SOLR
40.0000 mg | INTRAVENOUS | Status: DC
  Administered 2012-02-10: 40 mg via INTRAVENOUS
  Filled 2012-02-10 (×2): qty 40

## 2012-02-10 MED ORDER — POTASSIUM CHLORIDE IN NACL 20-0.9 MEQ/L-% IV SOLN
INTRAVENOUS | Status: DC
  Administered 2012-02-10 – 2012-02-13 (×7): via INTRAVENOUS
  Filled 2012-02-10 (×10): qty 1000

## 2012-02-10 MED ORDER — ONDANSETRON 4 MG PO TBDP
4.0000 mg | ORAL_TABLET | Freq: Four times a day (QID) | ORAL | Status: DC | PRN
  Administered 2012-02-10: 4 mg via ORAL
  Filled 2012-02-10: qty 1

## 2012-02-10 MED ORDER — ONDANSETRON HCL 4 MG/2ML IJ SOLN
4.0000 mg | Freq: Four times a day (QID) | INTRAMUSCULAR | Status: DC
  Administered 2012-02-10 – 2012-02-14 (×16): 4 mg via INTRAVENOUS
  Filled 2012-02-10 (×15): qty 2

## 2012-02-10 MED ORDER — PROMETHAZINE HCL 25 MG/ML IJ SOLN
25.0000 mg | Freq: Four times a day (QID) | INTRAMUSCULAR | Status: DC | PRN
  Administered 2012-02-10: 25 mg via INTRAMUSCULAR
  Filled 2012-02-10: qty 1

## 2012-02-10 MED ORDER — THIAMINE HCL 100 MG/ML IJ SOLN
100.0000 mg | Freq: Every day | INTRAMUSCULAR | Status: AC
Start: 2012-02-10 — End: 2012-02-12
  Administered 2012-02-10 – 2012-02-12 (×3): 100 mg via INTRAVENOUS
  Filled 2012-02-10 (×3): qty 1

## 2012-02-10 MED ORDER — SODIUM CHLORIDE 0.9 % IV SOLN: 40.0000 mg | INTRAVENOUS | Status: DC

## 2012-02-10 MED ORDER — ONDANSETRON HCL 4 MG/2ML IJ SOLN
4.0000 mg | Freq: Four times a day (QID) | INTRAMUSCULAR | Status: DC | PRN
  Filled 2012-02-10 (×2): qty 2

## 2012-02-10 MED ORDER — CIPROFLOXACIN IN D5W 400 MG/200ML IV SOLN
400.0000 mg | Freq: Two times a day (BID) | INTRAVENOUS | Status: AC
  Administered 2012-02-10 – 2012-02-12 (×6): 400 mg via INTRAVENOUS
  Filled 2012-02-10 (×6): qty 200

## 2012-02-10 NOTE — Consult Note (Signed)
Referring Provider:  Dr. Biagio Quint Primary Care Physician:  Garth Schlatter, MD Primary Gastroenterologist:  None  Reason for Consultation:  Nausea and vomiting.  Surgery requesting EGD.  HPI: Carolyn Morrison is a 51 y.o. female approximately 6 weeks status post scopic Roux-en-Y gastric bypass. She says that she was doing fine until about 5-6 days ago she began having nausea. Since then she has had intractable nausea and dry heaves without any significant vomiting but she has not really been able to eat or he was dehydrated because of the nausea. She says that she can keep liquids down but she just cannot take down enough fluids to maintain her hydration. She was seen in the emergency room yesterday and she was given several medications and eventually the nausea improved enough for discharge. However, while driving home, she had return of the nausea and a continuous today. She was given antibiotics for treatment of a UTI but she has not had any urinary symptoms. She denies any abdominal pain, bowel issues, fevers, chills, dizziness, vertigo, headache, etc.  Never had anything similar to this in the past.  Says that she wants to eat, but can't because of the nausea.  Has not started any new medications.  Does take celebrex once daily for several years.  Had cholecystectomy several years ago as well.  Has been on Nexium (or some sort of PPI) for approximately twenty years; saw a GI physician at that time and had an EGD but experienced no problems since then.  UGI shows patent gastrojejunostomy and no significant reflux.  Labs are unremarkable except for low potassium.  Past Medical History  Diagnosis Date  . GERD (gastroesophageal reflux disease)   . Arthritis   . Hypertension   . Hyperlipidemia     Past Surgical History  Procedure Date  . Cesarean section 1992  . Cholecystectomy 2001  . Facial reconstruction surgery 1978  . Appendectomy 1976  . Wisdom tooth extraction   . 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 Authorizing Provider  B Complex Vitamins (VITAMIN-B COMPLEX PO) Take by mouth daily.    Yes Historical Provider, MD  CELEBREX 200 MG capsule Take 200 mg by mouth daily.  01/24/12  Yes Historical Provider, MD  CYMBALTA 60 MG capsule Take 60 mg by mouth every morning.  07/06/11  Yes Historical Provider, MD  MICARDIS HCT 80-12.5 MG per tablet Take 1 tablet by mouth every morning.  07/12/11  Yes Historical Provider, MD  Multiple Vitamin (MULTIVITAMIN WITH MINERALS) TABS Take 1 tablet by mouth daily.   Yes Historical Provider, MD  NEXIUM 40 MG capsule Take 40 mg by mouth every morning.  07/12/11  Yes Historical Provider, MD  promethazine (PHENERGAN) 25 MG tablet Take 1 tablet (25 mg total) by mouth every 6 (six) hours as needed for nausea. 02/09/12  Yes Hannah Muthersbaugh, PA-C  sulfamethoxazole-trimethoprim (SEPTRA DS) 800-160 MG per tablet Take 1 tablet by mouth every 12 (twelve) hours. 02/09/12  Yes Hannah Muthersbaugh, PA-C  traMADol (ULTRAM) 50 MG tablet 50 mg daily as needed.  01/10/12  Yes Historical Provider, MD  oxyCODONE-acetaminophen (ROXICET) 5-325 MG/5ML solution (never got filled after surgery) Take 5-10 mLs by mouth every 4 (four) hours as needed. Pain. 01/05/12   Lodema Pilot, DO  Current Facility-Administered Medications  Medication Dose Route Frequency Provider Last Rate Last Dose  . 0.9 % NaCl with KCl 20 mEq/ L  infusion   Intravenous Continuous Lodema Pilot, DO      . ciprofloxacin (CIPRO) IVPB 400 mg  400 mg Intravenous Q12H Lodema Pilot, DO      . enoxaparin (LOVENOX) injection 40 mg  40 mg Subcutaneous Q24H Lodema Pilot, DO   40 mg at 02/10/12 1147  . influenza  inactive virus vaccine (FLUZONE/FLUARIX) injection 0.5 mL  0.5 mL  Intramuscular Prior to discharge Lodema Pilot, DO      . ondansetron [facility]) injection 4 mg  4 mg Intravenous Q6H PRN Lodema Pilot, DO      . pantoprazole (PROTONIX) injection 40 mg  40 mg Intravenous Q24H Lodema Pilot, DO      . promethazine (PHENERGAN) injection 25 mg  25 mg Intramuscular Q6H PRN Lodema Pilot, DO   25 mg at 02/10/12 1147  . sucralfate (CARAFATE) 1 GM/10ML suspension 1 g  1 g Oral TID WC & HS Lodema Pilot, DO      . thiamine (B-1) injection 100 mg  100 mg Intravenous Daily Lodema Pilot, DO   100 mg at 02/10/12 1146  . DISCONTD: dextrose 5 % and 0.45 % NaCl with KCl 20 mEq/L infusion   Intravenous Continuous Lodema Pilot, DO       Facility-Administered Medications Ordered in Other Encounters  Medication Dose Route Frequency Provider Last Rate Last Dose  . dextrose 5 % and 0.45 % NaCl with KCl 20 mEq/L infusion   Intravenous Once [employer], PA-C      . DISCONTD: ciprofloxacin (CIPRO) IVPB 400 mg  400 mg Intravenous Q12H Lodema Pilot, DO      . DISCONTD: pantoprazole (PROTONIX) 40 mg in sodium chloride 0.9 % 100 mL IVPB  40 mg Intravenous Q24H Lodema Pilot, DO        Allergies as of 02/10/2012  . (No Known Allergies)    Family History  Problem Relation Age of Onset  . Diabetes Maternal Aunt     History   Social History  . Marital Status: Single    Spouse Name: N/A    Number of Children: N/A  . Years of Education: N/A   Occupational History  . Not on file.   Social History Main Topics  . Smoking status: Former Smoker -- 0.2 packs/day    Quit date: 10/13/2011  . Smokeless tobacco: Never Used  . Alcohol Use: Yes     occasional  . Drug Use: No  . Sexually Active: No   Other Topics Concern  . Not on file   Social History Narrative  . No narrative on file    Review of Systems: Ten point ROS is O/W negative except as mentioned in HPI.  Physical Exam: Vital signs in last 24 hours: Temp:  [98.1 F (36.7 C)-99.3 F (37.4 C)] 99.3 F (37.4 C)  (10/04 1436) Pulse Rate:  [55-78] 58  (10/04 1436) Resp:  [18] 18  (10/04 1436) BP: (118-160)/(73-122) 118/73 mmHg (10/04 1436) SpO2:  [99 %] 99 % (10/04 1436)   General:   Alert,  Well-developed, well-nourished, pleasant and cooperative in NAD, but uncomfortable due to nausea. Head:  Normocephalic and atraumatic. Eyes:  Sclera clear, no icterus.  Conjunctiva pink. Ears:  Normal auditory acuity. Mouth:  No deformity or lesions.  Lips are very dry.   Lungs:  Clear throughout to auscultation.   No wheezes, crackles, or rhonchi.  Heart:  Regular rate and rhythm; no murmurs, clicks, rubs,  or gallops. Abdomen:  Soft, nontender, BS active, nonpalp mass or hsm.   Rectal:  Deferred.  Msk:  Symmetrical without gross deformities. Pulses:  Normal pulses noted. Extremities:  Without clubbing or edema. Neurologic:  Alert and  oriented x4; grossly normal neurologically. Skin:  Intact without significant lesions or rashes. Psych:  Alert and cooperative. Normal mood and affect.   Lab Results:  Basename 02/10/12 1035 02/09/12 1045  WBC 7.0 7.5  HGB 13.3 14.1  HCT 39.0 41.5  PLT 207 233   BMET  Basename 02/10/12 1035 02/09/12 1045  NA 139 136  K 3.2* 2.9*  CL 104 98  CO2 22 23  GLUCOSE 111* 105*  BUN 5* 8  CREATININE 0.82 0.73  CALCIUM 9.3 9.8   LFT  Basename 02/10/12 1035  PROT 6.5  ALBUMIN 3.5  AST 13  ALT 11  ALKPHOS 79  BILITOT 0.4  BILIDIR --  IBILI --   Studies/Results: Dg Abd Acute W/chest  02/09/2012  *RADIOLOGY REPORT*  Clinical Data: Shortness of breath, coughing, nausea, vomiting, evaluate for obstruction  ACUTE ABDOMEN SERIES (ABDOMEN 2 VIEW & CHEST 1 VIEW)  Comparison: None.  Findings:  Normal cardiac silhouette and mediastinal contours. There is mild diffuse thickening of the pulmonary interstitium.  No focal airspace opacities.  No pleural effusion or pneumothorax.  Paucity of bowel gas without evidence of obstruction.  Surgical clips overlying the expected  location of the gastric fundus. Additional surgical staple line overlies the left mid hemiabdomen. No pneumoperitoneum, pneumatosis or portal venous gas.  Post cholecystectomy.  No definite abnormal intra-abdominal calcifications.  An IUD overlies the pelvis.  Lumbar spine degenerative change. Severe left sided hip degenerative change.  IMPRESSION:  1.  Postsurgical change of the abdomen without definite evidence of obstruction.  2.  Findings suggestive of airways disease.  No focal airspace opacities to suggest pneumonia.  3.  Severe left sided hip degenerative change.   Original Report Authenticated By: Waynard Reeds, M.D.    Dg Ugi W/o Kub  02/10/2012  *RADIOLOGY REPORT*  Clinical Data:  6 weeks status post gastric bypass, nausea/vomiting  UPPER GI SERIES WITHOUT KUB  Technique:  Routine upper GI series was performed with thin barium.  Fluoroscopy Time: 1.13 minutes  Comparison:  None.  Findings: Mid/distal esophagus is mildly patulous but is notable for normal esophageal motility.  No fixed esophageal narrowing/stricture.  Unremarkable appearance of the gastric pouch.  Adjacent surgical sutures.  Patent gastrojejunostomy.  The visualized efferent limb is nondilated and within normal limits.  A small portion of the afferent limb is visualized and appears unremarkable.  No evidence of gastroesophageal reflux.  Cholecystectomy clips in the right upper abdomen.  IMPRESSION: Patent gastrojejunostomy.  No evidence of gastroesophageal reflux.   Original Report Authenticated By: Charline Bills, M.D.   Images viewed Iva Boop, MD, [facility]   IMPRESSION:  -Intractable nausea x 1 week.  Unsure of the cause.  Is s/p Roux-en-Y 6 weeks ago.  Has patent gastrojejunostomy. -Dehydration due to decreased po intake. -Hypokalemia  PLAN: -Dr. Biagio Quint would like our service to perform an EGD to rule out ulcer or stricture, etc.  I will discuss with Dr. Leone Payor. -Should receive antiemetics around the clock rather  than prn. -Agree with trial of carafate suspension for now.   ZEHR, JESSICA D.  02/10/2012, 2:45 PM  Pager number [phone]  [facility] Attending  I have also seen and  assessed the patient and agree with the above note. My hx and PE same.  Protracted nausea and vomiting in setting of laparoscopic Roux-en-Y bypass surgery for obesity 6 weeks ago. Will perform EGD tomorrow. The risks and benefits as well as alternatives of endoscopic procedure(s) have been discussed and reviewed. All questions answered. The patient agrees to proceed.   Iva Boop, MD, [facility] [phone] (pager) 02/10/2012 5:04 PM

## 2012-02-10 NOTE — Progress Notes (Signed)
Patient ID: Carolyn Morrison, female   DOB: 09-06-1960, 51 y.o.   MRN: [id number]  No chief complaint on file.   HPI Carolyn Morrison is a 51 y.o. female.  This patient follows up approximately 6 weeks status post scopic Roux-en-Y gastric bypass. She says that she was doing fine until about 5-6 days ago she began having nausea. Since then she has had intractable nausea without any significant vomiting but she has not really been able to eat or he was dehydrated because of the nausea.  She says that she can keep liquids down but she just cannot take down enough fluids to maintain her hydration. She was seen in the emergency room yesterday and she was given several medications and eventually the nausea improved enough for discharge. However, while driving home, she had return of the nausea and a continuous today. She was given antibiotics for treatment of a UTI but she has not had any urinary symptoms. She denies any abdominal pain, or fevers or chills. She has had loose bowels but no obvious diarrhea. HPI  Past Medical History  Diagnosis Date  . GERD (gastroesophageal reflux disease)   . Arthritis   . Hypertension   . Hyperlipidemia     Past Surgical History  Procedure Date  . Cesarean section 1992  . Cholecystectomy 2001  . Facial reconstruction surgery 1978  . Appendectomy 1976  . Wisdom tooth extraction   . Gastric roux-en-y 01/03/2012    Procedure: LAPAROSCOPIC ROUX-EN-Y GASTRIC BYPASS WITH UPPER ENDOSCOPY;  Surgeon: Lodema Pilot, DO;  Location: [facility];  Service: General;  Laterality: N/A;    Family History  Problem Relation Age of Onset  . Diabetes Maternal Aunt     Social History History  Substance Use Topics  . Smoking status: Former Smoker -- 0.2 packs/day    Quit date: 10/13/2011  . Smokeless tobacco: Never Used  . Alcohol Use: Yes     occasional    No Known Allergies  Current Outpatient Prescriptions  Medication Sig Dispense Refill  . B Complex Vitamins  (VITAMIN-B COMPLEX PO) Take by mouth daily.       . CELEBREX 200 MG capsule Take 200 mg by mouth daily.       . CYMBALTA 60 MG capsule Take 60 mg by mouth every morning.       Marland Kitchen MICARDIS HCT 80-12.5 MG per tablet Take 1 tablet by mouth every morning.       . Multiple Vitamin (MULTIVITAMIN WITH MINERALS) TABS Take 1 tablet by mouth daily.      Marland Kitchen NEXIUM 40 MG capsule Take 40 mg by mouth every morning.       Marland Kitchen oxyCODONE-acetaminophen (ROXICET) 5-325 MG/5ML solution Take 5-10 mLs by mouth every 4 (four) hours as needed.  500 mL  0  . promethazine (PHENERGAN) 25 MG tablet Take 1 tablet (25 mg total) by mouth every 6 (six) hours as needed for nausea.  12 tablet  0  . sulfamethoxazole-trimethoprim (SEPTRA DS) 800-160 MG per tablet Take 1 tablet by mouth every 12 (twelve) hours.  20 tablet  0  . traMADol (ULTRAM) 50 MG tablet Ad lib.       No current facility-administered medications for this visit.   Facility-Administered Medications Ordered in Other Visits  Medication Dose Route Frequency Provider Last Rate Last Dose  . dextrose 5 % and 0.45 % NaCl with KCl 20 mEq/L infusion   Intravenous Once [employer], PA-C      . diphenhydrAMINE (BENADRYL)  injection 25 mg  25 mg Intravenous Once [employer], PA-C   25 mg at 02/09/12 1303  . famotidine (PEPCID) IVPB 20 mg  20 mg Intravenous Once [employer], PA-C   20 mg at 02/09/12 1319  . gi cocktail (Maalox,Lidocaine,Donnatal)  30 mL Oral Once Hannah Muthersbaugh, PA-C   30 mL at 02/09/12 1317  . metoCLOPramide (REGLAN) injection 10 mg  10 mg Intravenous Once [employer], PA-C   10 mg at 02/09/12 1306  . ondansetron (ZOFRAN) injection 4 mg  4 mg Intravenous Once Hannah Muthersbaugh, PA-C   4 mg at 02/09/12 1042  . ondansetron (ZOFRAN) injection 4 mg  4 mg Intravenous Once Hannah Muthersbaugh, PA-C   4 mg at 02/09/12 1216  . potassium chloride 20 MEQ/15ML (10%) liquid 40 mEq  40 mEq Oral Once [employer], PA-C   40 mEq at  02/09/12 1339  . sodium chloride 0.9 % bolus 1,000 mL  1,000 mL Intravenous Once Hannah Muthersbaugh, PA-C   1,000 mL at 02/09/12 1045  . DISCONTD: potassium chloride (KLOR-CON) packet 40 mEq  40 mEq Oral Once [employer], PA-C        Review of Systems Review of Systems All other review of systems negative or noncontributory except as stated in the HPI  There were no vitals taken for this visit.  Physical Exam Physical Exam Physical Exam  Nursing note and vitals reviewed. Constitutional: She is oriented to person, place, and time. She appears well-developed and well-nourished. No distress.  HENT:  Head: Normocephalic and atraumatic.  Mouth/Throat: No oropharyngeal exudate.  Eyes: Conjunctivae and EOM are normal. Pupils are equal, round, and reactive to light. Right eye exhibits no discharge. Left eye exhibits no discharge. No scleral icterus.  Neck: Normal range of motion. Neck supple. No tracheal deviation present.  Cardiovascular: Normal rate, regular rhythm, normal heart sounds and intact distal pulses.   Pulmonary/Chest: Effort normal and breath sounds normal. No stridor. No respiratory distress. She has no wheezes.  Abdominal: Soft. Bowel sounds are normal. She exhibits no distension and no mass. There is no tenderness. There is no rebound and no guarding.  Musculoskeletal: Normal range of motion. She exhibits no edema and no tenderness.  Neurological: She is alert and oriented to person, place, and time.  Skin: Skin is warm and dry. No rash noted. She is not diaphoretic. No erythema. No pallor.  Psychiatric: She has a normal mood and affect. Her behavior is normal. Judgment and thought content normal.    Data Reviewed   Assessment    Nausea She has had persistent nausea in inability to maintain her hydration. Even in the office today she is retching.  Have recommended admission to the hospital for IV hydration and possible workup. We will set her up for upper GI and  upper endoscopy to evaluate for stricture. This could be due to her recently diagnosed UTI but her symptoms seem extreme for that. We will also continue her on antibiotics for suspected UTI.    Plan    We will admit her to the hospital for IV hydration and further workup.       Lodema Pilot DAVID 02/10/2012, 8:59 AM

## 2012-02-11 ENCOUNTER — Encounter (HOSPITAL_COMMUNITY): Payer: Self-pay | Admitting: *Deleted

## 2012-02-11 ENCOUNTER — Encounter (HOSPITAL_COMMUNITY)
Admission: AD | Disposition: A | Discharge status: Home / Self Care | Payer: Self-pay | Source: Ambulatory Visit | Attending: General Surgery

## 2012-02-11 DIAGNOSIS — R110 Nausea: Secondary | ICD-10-CM

## 2012-02-11 DIAGNOSIS — K289 Gastrojejunal ulcer, unspecified as acute or chronic, without hemorrhage or perforation: Secondary | ICD-10-CM | POA: Diagnosis present

## 2012-02-11 DIAGNOSIS — R109 Unspecified abdominal pain: Secondary | ICD-10-CM

## 2012-02-11 HISTORY — PX: ESOPHAGOGASTRODUODENOSCOPY: SHX5428

## 2012-02-11 LAB — CBC
HCT: 36.5 % (ref 36.0–46.0)
Hemoglobin: 12.2 g/dL (ref 12.0–15.0)
MCH: 29.2 pg (ref 26.0–34.0)
MCHC: 33.4 g/dL (ref 30.0–36.0)
MCV: 87.3 fL (ref 78.0–100.0)
RBC: 4.18 MIL/uL (ref 3.87–5.11)

## 2012-02-11 LAB — BASIC METABOLIC PANEL
BUN: 4 mg/dL — ABNORMAL LOW (ref 6–23)
CO2: 23 mEq/L (ref 19–32)
Glucose, Bld: 82 mg/dL (ref 70–99)
Potassium: 3.1 mEq/L — ABNORMAL LOW (ref 3.5–5.1)
Sodium: 137 mEq/L (ref 135–145)

## 2012-02-11 SURGERY — EGD (ESOPHAGOGASTRODUODENOSCOPY)
Anesthesia: Moderate Sedation

## 2012-02-11 MED ORDER — FENTANYL CITRATE 0.05 MG/ML IJ SOLN
INTRAMUSCULAR | Status: DC | PRN
  Administered 2012-02-11 (×2): 25 ug via INTRAVENOUS

## 2012-02-11 MED ORDER — PANTOPRAZOLE SODIUM 40 MG IV SOLR
40.0000 mg | Freq: Two times a day (BID) | INTRAVENOUS | Status: DC
  Administered 2012-02-11 – 2012-02-14 (×7): 40 mg via INTRAVENOUS
  Filled 2012-02-11 (×8): qty 40

## 2012-02-11 MED ORDER — SODIUM CHLORIDE 0.9 % IV SOLN: INTRAVENOUS | Status: DC

## 2012-02-11 MED ORDER — BUTAMBEN-TETRACAINE-BENZOCAINE 2-2-14 % EX AERO
INHALATION_SPRAY | CUTANEOUS | Status: DC | PRN
  Administered 2012-02-11: 1 via TOPICAL

## 2012-02-11 MED ORDER — MIDAZOLAM HCL 10 MG/2ML IJ SOLN
INTRAMUSCULAR | Status: DC | PRN
  Administered 2012-02-11: 1 mg via INTRAVENOUS
  Administered 2012-02-11: 2 mg via INTRAVENOUS
  Administered 2012-02-11: 1 mg via INTRAVENOUS
  Administered 2012-02-11: 2 mg via INTRAVENOUS

## 2012-02-11 NOTE — Op Note (Addendum)
[facility] [address] [place], [address]   ENDOSCOPY PROCEDURE REPORT  PATIENT: Mada, Sadik  MR#: [id number] BIRTHDATE: 04-28-1961 , 51  yrs. old GENDER: Female ENDOSCOPIST: Iva Boop, MD, [facility] REFERRED BY:  Lodema Pilot, D.O. PROCEDURE DATE:  02/11/2012 PROCEDURE:  EGD, diagnostic ASA CLASS:     Class III INDICATIONS:  vomiting.   nausea. MEDICATIONS: Fentanyl 50 mcg IV and Versed 5 mg IV TOPICAL ANESTHETIC: Cetacaine Spray  DESCRIPTION OF PROCEDURE: After the risks benefits and alternatives of the procedure were thoroughly explained, informed consent was obtained.  The Pentax Gastroscope [id number] endoscope was introduced through the mouth and advanced to the proximal jejunum. Without limitations.  The instrument was slowly withdrawn as the mucosa was fully examined.      STOMACH: A Roux -en-Y anastomosis was found at 46 cm from incisors. Ulceration was seen at the site. Nearly circumferential with deformity but not obstruction. Some dark pigment to base of ulcers but no clear bleeding stigmata otherwise.  The mucosa of the stomach pouch proximal  appeared normal.  JEJUNUM: Multiple  small erosions were found in the jejunum 3-5 cm beyond anastomoais.  ESOPHAGUS: The mucosa of the esophagus appeared normal. Z-line at 40 cm.  Retroflexion in the pouch was not performed/possible.     The scope was then withdrawn from the patient and the procedure completed.  COMPLICATIONS: There were no complications. ENDOSCOPIC IMPRESSION: 1.   Roux -en-Y anastomosis was found with ulceration and some deformity but no obstruction 2.   The mucosa of the stomach appeared normal 3.   Erosions werefound in the jejunum close to anastomosis 4.   The mucosa of the esophagus appeared normal  RECOMMENDATIONS: 1.  continue PPI and go to bid, continue sucralfate 2.  avoid NSAIDS (was on Celebrex) 3.  endoscopy follow-up to document healing per Dr. Biagio Quint 4.   follow-up of helicobacter pylori status, treat if indicated - checking serology - was negative in March 5. Consider adding misoprostol but should be taken with food, am starting clears today  REPEAT EXAM: per Dr. Biagio Quint but 2-3 months from now seems appropriate  eSigned:  Iva Boop, MD, [facility] 02/11/2012 10:04 AM Revised: 02/11/2012 10:04 AM  XB:JYNWGN, Arlys John DO  PATIENT NAME:  Larinda Buttery MR#: [id number]

## 2012-02-11 NOTE — Progress Notes (Signed)
Patient ID: Carolyn Morrison, female   DOB: March 25, 1961, 51 y.o.   MRN: [id number]  General Surgery - [facility] - Progress Note  Subjective: Patient back in room after EGD.  Apparently with small anastomotic ulceration, no obstruction.  GI recommends clear liquid diet today.  Patient really wants to eat solid food.  No nausea at present.  Objective: Vital signs in last 24 hours: Temp:  [97.3 F (36.3 C)-99.3 F (37.4 C)] 99.1 F (37.3 C) (10/05 1103) Pulse Rate:  [42-76] 62  (10/05 1103) Resp:  [12-21] 16  (10/05 1103) BP: (111-177)/(43-128) 111/68 mmHg (10/05 1103) SpO2:  [92 %-100 %] 98 % (10/05 1103) Weight:  [350 lb 6.4 oz (158.94 kg)] 350 lb 6.4 oz (158.94 kg) 02-23-2023 1707) Last BM Date: 02/07/12  Intake/Output from previous day: Feb 23, 2023 0701 - 10/05 0700 In: 1725 [I.V.:1725] Out: 1000 [Urine:1000]  Exam: HEENT - clear, not icteric Abd - soft, obese; no tenderness; no mass Ext - no significant edema Neuro - grossly intact, no focal deficits  Lab Results:   Basename 02/11/12 0411 23-Feb-2012 1035  WBC 6.9 7.0  HGB 12.2 13.3  HCT 36.5 39.0  PLT 189 207     Basename 02/11/12 0411 2012/02/23 1035  NA 137 139  K 3.1* 3.2*  CL 104 104  CO2 23 22  GLUCOSE 82 111*  BUN 4* 5*  CREATININE 0.77 0.82  CALCIUM 8.8 9.3    Studies/Results: Dg Ugi W/o Kub  23-Feb-2012  *RADIOLOGY REPORT*  Clinical Data:  6 weeks status post gastric bypass, nausea/vomiting  UPPER GI SERIES WITHOUT KUB  Technique:  Routine upper GI series was performed with thin barium.  Fluoroscopy Time: 1.13 minutes  Comparison:  None.  Findings: Mid/distal esophagus is mildly patulous but is notable for normal esophageal motility.  No fixed esophageal narrowing/stricture.  Unremarkable appearance of the gastric pouch.  Adjacent surgical sutures.  Patent gastrojejunostomy.  The visualized efferent limb is nondilated and within normal limits.  A small portion of the afferent limb is visualized and  appears unremarkable.  No evidence of gastroesophageal reflux.  Cholecystectomy clips in the right upper abdomen.  IMPRESSION: Patent gastrojejunostomy.  No evidence of gastroesophageal reflux.   Original Report Authenticated By: Charline Bills, M.D.     Assessment / Plan: 1.  Marginal ulceration after gastric bypass procedure  - PPI per GI  - clear liquid diet today  - Rx nausea as needed  - OOB, ambulate  Velora Heckler, MD, [facility] Office: [phone]  02/11/2012

## 2012-02-11 NOTE — Progress Notes (Signed)
Pt requesting ginger ale. Explained that carbonated sugar soda is not part of bariatric diet.  Patient states understanding although still would like the ginger ale.

## 2012-02-12 DIAGNOSIS — R110 Nausea: Secondary | ICD-10-CM

## 2012-02-12 MED ORDER — ENOXAPARIN SODIUM 80 MG/0.8ML ~~LOC~~ SOLN
80.0000 mg | SUBCUTANEOUS | Status: DC
  Administered 2012-02-12 – 2012-02-13 (×2): 80 mg via SUBCUTANEOUS
  Filled 2012-02-12 (×4): qty 0.8

## 2012-02-12 NOTE — Progress Notes (Signed)
Patient ID: Carolyn Morrison, female   DOB: [birth year], 51 y.o.   MRN: [id number]  General Surgery - [facility] - Progress Note  Subjective: Patient still complains of pain and nausea.  Beginning regular bariatric diet today.  BM last night.  Objective: Vital signs in last 24 hours: Temp:  [98.4 F (36.9 C)-99.4 F (37.4 C)] 98.4 F (36.9 C) (10/06 0458) Pulse Rate:  [59-74] 66  (10/06 0458) Resp:  [16-18] 16  (10/06 0458) BP: (111-150)/(68-83) 131/77 mmHg (10/06 0458) SpO2:  [96 %-98 %] 96 % (10/06 0458) Last BM Date: 02/07/12  Intake/Output from previous day: 10/05 0701 - 10/06 0700 In: 2965 [P.O.:240; I.V.:2525; IV Piggyback:200] Out: 2625 [Urine:2625]  Exam: HEENT - clear, not icteric Chest - clear, bilaterally Cor - RRR, no murmur Abd - soft, obese; BS present; minimally tender to exam Ext - no significant edema Neuro - grossly intact, no focal deficits  Lab Results:   Basename 02/11/12 0411 02/15/12 1035  WBC 6.9 7.0  HGB 12.2 13.3  HCT 36.5 39.0  PLT 189 207     Basename 02/11/12 0411 02/15/2012 1035  NA 137 139  K 3.1* 3.2*  CL 104 104  CO2 23 22  GLUCOSE 82 111*  BUN 4* 5*  CREATININE 0.77 0.82  CALCIUM 8.8 9.3    Studies/Results: Dg Ugi W/o Kub  February 15, 2012  *RADIOLOGY REPORT*  Clinical Data:  6 weeks status post gastric bypass, nausea/vomiting  UPPER GI SERIES WITHOUT KUB  Technique:  Routine upper GI series was performed with thin barium.  Fluoroscopy Time: 1.13 minutes  Comparison:  None.  Findings: Mid/distal esophagus is mildly patulous but is notable for normal esophageal motility.  No fixed esophageal narrowing/stricture.  Unremarkable appearance of the gastric pouch.  Adjacent surgical sutures.  Patent gastrojejunostomy.  The visualized efferent limb is nondilated and within normal limits.  A small portion of the afferent limb is visualized and appears unremarkable.  No evidence of gastroesophageal reflux.  Cholecystectomy clips  in the right upper abdomen.  IMPRESSION: Patent gastrojejunostomy.  No evidence of gastroesophageal reflux.   Original Report Authenticated By: Charline Bills, M.D.     Assessment / Plan: 1.  Persistent nausea, pain after bypass procedure  - marginal ulcers noted on EGD  - PPI dose increased by GI, on carafate  - bariatric diet started this AM  - appreciate GI management and recommendations  - encouraged to ambulate, OOB  - Dr. Biagio Quint to see in AM 10/7  Velora Heckler, MD, [facility] Office: [phone]  02/12/2012

## 2012-02-12 NOTE — Progress Notes (Addendum)
     [facility] Daily Rounding Note 02/12/2012, 9:40 AM  SUBJECTIVE: Yesterday was better but now very nauseous and hungry. Some dry heaves. Last night was requesting carbonated soda with sugar - RN informed not part of bariatric diet  OBJECTIVE: General: NAD  Vital signs in last 24 hours: Temp:  [97.7 F (36.5 C)-99.4 F (37.4 C)] 98.4 F (36.9 C) (10/06 0458) Pulse Rate:  [59-74] 66  (10/06 0458) Resp:  [16-20] 16  (10/06 0458) BP: (111-150)/(68-83) 131/77 mmHg (10/06 0458) SpO2:  [96 %-98 %] 96 % (10/06 0458) Last BM Date: 02/07/12   Abdomen: non-tender, soft Neuro/Psych: ? Anxious  Intake/Output from previous day: 10/05 0701 - 10/06 0700 In: 2965 [P.O.:240; I.V.:2525; IV Piggyback:200] Out: 2625 [Urine:2625]    Studies/Results:   ENDOSCOPIC IMPRESSION:  EGD 10/5 1. Roux -en-Y anastomosis was found with ulceration and some  deformity but no obstruction  2. The mucosa of the stomach appeared normal  3. Erosions werefound in the jejunum close to anastomosis  4. The mucosa of the esophagus appeared normal  RECOMMENDATIONS:  1. continue PPI and go to bid, continue sucralfate  2. avoid NSAIDS (was on Celebrex)  3. endoscopy follow-up to document healing per Dr. Biagio Quint  4. follow-up of helicobacter pylori status, treat if indicated -  checking serology - was negative in March  5. Consider adding misoprostol but should be taken with food, am  starting clears today    ASSESMENT: 1) Nausea > dry heaves 2) gastrojejunal ulcer s/p Roux-en-Y lap bypass   PLAN: 1) Will see if she can handle food - bariatric diet started - not contraindicated with findings at EGD as the anastomosis is patent  2) Continue anti-emetics 3) ? Non GI cause of nausea  4) H. Pylori Ab pending 5) misoprostol an option but may intensify nausea.Marland Kitchen? 6) OK to increase enoxaparen prophylaxis I think - ordered   LOS: 2 days   Stan Head  02/12/2012, 9:40 AM  Pager [phone]

## 2012-02-13 ENCOUNTER — Encounter (HOSPITAL_COMMUNITY): Payer: Self-pay | Admitting: Internal Medicine

## 2012-02-13 MED ORDER — KCL IN DEXTROSE-NACL 20-5-0.45 MEQ/L-%-% IV SOLN
INTRAVENOUS | Status: DC
  Administered 2012-02-13 – 2012-02-14 (×3): via INTRAVENOUS
  Filled 2012-02-13 (×6): qty 1000

## 2012-02-13 NOTE — Progress Notes (Signed)
[facility] Progress Note  Subjective:  Feeling better today.  Able to keep down small amounts of food yesterday and this AM.  Objective:  Vital signs in last 24 hours: Temp:  [97.4 F (36.3 C)-99.3 F (37.4 C)] 98.9 F (37.2 C) (10/07 0501) Pulse Rate:  [65-79] 65  (10/07 0501) Resp:  [16] 16  (10/07 0501) BP: (110-133)/(59-80) 133/80 mmHg (10/07 0501) SpO2:  [95 %-98 %] 95 % (10/07 0501) Last BM Date: 02/13/12 General:   Alert, Well-developed, in NAD Heart:  Regular rate and rhythm; no murmurs Pulm:  CTAB.  No W/R/R. Abdomen:  Soft, nontender and nondistended. Normal bowel sounds, without guarding, and without rebound.   Extremities:  Without edema. Neurologic:  Alert and  oriented x4;  grossly normal neurologically. Psych:  Alert and cooperative. Normal mood and affect.  Intake/Output from previous day: 10/06 0701 - 10/07 0700 In: 3728.8 [P.O.:360; I.V.:2968.8; IV Piggyback:400] Out: 2400 [Urine:2400] Intake/Output this shift: Total I/O In: 480 [P.O.:480] Out: 701 [Urine:700; Stool:1]  Lab Results:  [facility] 02/11/12 0411 02/10/12 1035  WBC 6.9 7.0  HGB 12.2 13.3  HCT 36.5 39.0  PLT 189 207   BMET  Basename 02/11/12 0411 02/10/12 1035  NA 137 139  K 3.1* 3.2*  CL 104 104  CO2 23 22  GLUCOSE 82 111*  BUN 4* 5*  CREATININE 0.77 0.82  CALCIUM 8.8 9.3   Assessment / Plan: 1) Nausea > dry heaves:  Improving.  2) Gastrojejunal ulcer s/p Roux-en-Y lap bypass  *Continue anti-emetics prn. *Avoid NSAID's, including celebrex. *Continue BID PPI and carafate ACHS. *H. Pylori Ab pending.  Follow-up results.  *Misoprostol an option but may intensify nausea..? *If nausea continues despite appropriate therapy for ulcer disease, would consider evaluation for non-GI source of nausea. *Will sign off from GI standpoint.  Call if needed.    LOS: 3 days   ZEHR, JESSICA D.  02/13/2012, 9:31 AM  Pager number [phone]

## 2012-02-13 NOTE — Care Management Note (Signed)
    Page 1 of 1   02/13/2012     12:53:07 PM   CARE MANAGEMENT NOTE 02/13/2012  Patient:  Carolyn Morrison, Carolyn Morrison   Account Number:  [id number]  Date Initiated:  02/13/2012  Documentation initiated by:  Lorenda Ishihara  Subjective/Objective Assessment:   51 yo female admitted intractable nausea, s/p gastric bypass 6 weeks prior. PTA lived at home with boyfriend.     Action/Plan:   Anticipated DC Date:  02/14/2012   Anticipated DC Plan:  HOME/SELF CARE      DC Planning Services  CM consult      Choice offered to / List presented to:             Status of service:  Completed, signed off Medicare Important Message given?   (If response is "NO", the following Medicare IM given date fields will be blank) Date Medicare IM given:   Date Additional Medicare IM given:    Discharge Disposition:  HOME/SELF CARE  Per UR Regulation:  Reviewed for med. necessity/level of care/duration of stay  If discussed at Long Length of Stay Meetings, dates discussed:    Comments:

## 2012-02-13 NOTE — Progress Notes (Signed)
2 Days Post-Op  Subjective: Feeling better but on nausea medications  Objective: Vital signs in last 24 hours: Temp:  [97.4 F (36.3 C)-99.3 F (37.4 C)] 98.9 F (37.2 C) (10/07 0501) Pulse Rate:  [65-79] 65  (10/07 0501) Resp:  [16] 16  (10/07 0501) BP: (110-133)/(59-80) 133/80 mmHg (10/07 0501) SpO2:  [95 %-98 %] 95 % (10/07 0501) Last BM Date: 02/12/12  Intake/Output from previous day: 10/06 0701 - 10/07 0700 In: 3728.8 [P.O.:360; I.V.:2968.8; IV Piggyback:400] Out: 2400 [Urine:2400] Intake/Output this shift:    General appearance: alert, cooperative and no distress Resp: clear to auscultation bilaterally Cardio: regular rate and rhythm, S1, S2 normal, no murmur, click, rub or gallop GI: soft, NT, ND, no peritoneal signs  Lab Results:   Basename 02/11/12 0411 02/10/12 1035  WBC 6.9 7.0  HGB 12.2 13.3  HCT 36.5 39.0  PLT 189 207   BMET  Basename 02/11/12 0411 02/10/12 1035  NA 137 139  K 3.1* 3.2*  CL 104 104  CO2 23 22  GLUCOSE 82 111*  BUN 4* 5*  CREATININE 0.77 0.82  CALCIUM 8.8 9.3   PT/INR No results found for this basename: LABPROT:2,INR:2 in the last 72 hours ABG No results found for this basename: PHART:2,PCO2:2,PO2:2,HCO3:2 in the last 72 hours  Studies/Results: No results found.  Anti-infectives: Anti-infectives     Start     Dose/Rate Route Frequency Ordered Stop   02/10/12 1100   ciprofloxacin (CIPRO) IVPB 400 mg        400 mg 200 mL/hr over 60 Minutes Intravenous Every 12 hours 02/10/12 1023 02/12/12 2306          Assessment/Plan: s/p Procedure(s) (LRB) with comments: ESOPHAGOGASTRODUODENOSCOPY (EGD) (N/A) regular bariatric diet, likely home tomorrow if continues to tolerate diet and nausea improved.  BID PPI and carafate.  her boyfriend and daughter are smoking at home and we again discussed the harm in this.    LOS: 3 days    Lodema Pilot DAVID 02/13/2012

## 2012-02-14 LAB — VITAMIN D 1,25 DIHYDROXY
Vitamin D 1, 25 (OH)2 Total: 78 pg/mL — ABNORMAL HIGH (ref 18–72)
Vitamin D2 1, 25 (OH)2: <8 pg/mL
Vitamin D3 1, 25 (OH)2: 78 pg/mL

## 2012-02-14 MED ORDER — ONDANSETRON 4 MG PO TBDP: 4.0000 mg | ORAL_TABLET | Freq: Four times a day (QID) | ORAL | Status: DC | PRN

## 2012-02-14 MED ORDER — ESOMEPRAZOLE MAGNESIUM 40 MG PO CPDR: 40.0000 mg | DELAYED_RELEASE_CAPSULE | Freq: Two times a day (BID) | ORAL | Status: AC

## 2012-02-14 MED ORDER — SUCRALFATE 1 GM/10ML PO SUSP: 1.0000 g | Freq: Three times a day (TID) | ORAL | Status: DC

## 2012-02-14 MED ORDER — ESOMEPRAZOLE MAGNESIUM 40 MG PO PACK: 40.0000 mg | PACK | Freq: Two times a day (BID) | ORAL | Status: DC

## 2012-02-14 MED ORDER — INFLUENZA VIRUS VACC SPLIT PF IM SUSP
0.5000 mL | Freq: Once | INTRAMUSCULAR | Status: AC
  Administered 2012-02-14: 0.5 mL via INTRAMUSCULAR
  Filled 2012-02-14: qty 0.5

## 2012-02-14 MED ORDER — PROMETHAZINE HCL 25 MG RE SUPP: 25.0000 mg | Freq: Four times a day (QID) | RECTAL | Status: DC | PRN

## 2012-02-14 NOTE — Discharge Summary (Signed)
Physician Discharge Summary  Patient ID: Carolyn Morrison MRN: [id number] DOB/AGE: 49-Feb-1962 69 y.o.  Admit date: 02/10/2012 Discharge date: 02/14/2012  Admission Diagnoses: nausea  Discharge Diagnoses: nausea and marginal ulcers Active Problems:  Uncontrollable nausea and vomiting  Gastrojejunal ulcer   Discharged Condition: good  Hospital Course: admitted for fluid hydration and diagnosis.  UGI performed was without stenosis.  GI consult and EGD performed which demonstrated marginal ulcers.  She was hydrated and treated with BID ppi and carafate and though she still had some residual nausea.  She was tolerating regular diet and stable for discharge to home on 02/14/12  Consults: GI  Significant Diagnostic Studies: EGD, UGI  Treatments: IV hydration  Discharge Exam: Blood pressure 148/76, pulse 66, temperature 98.6 F (37 C), temperature source Oral, resp. rate 18, height 5\' 6"  (1.676 m), weight 350 lb 6.4 oz (158.94 kg), SpO2 99.00%.   Disposition: 01-Home or Self Care  Discharge Orders    Future Appointments: Provider: Department: Dept Phone: Center:   02/28/2012 9:00 AM Orvil Feil Himmelrich, MS, RD [facility] [phone] NDM   03/28/2012 5:00 PM Lodema Pilot, DO [facility] [phone] None     Future Orders Please Complete By Expires   Increase activity slowly      Discharge instructions      Comments:   Follow up with Dr. Biagio Quint in 4 weeks. Make sure to drink no calorie beverages frequently to maintain hydration. NO SMOKING or second hand smoke NO aspirin, motrin, aleve, celebrex or other related medications Resume low fat, low carb, high protein diet   Call MD for:  persistant nausea and vomiting      Call MD for:  severe uncontrolled pain          Medication List     As of 02/14/2012  1:35 PM    STOP taking these medications         CELEBREX 200 MG capsule   Generic drug: celecoxib      sulfamethoxazole-trimethoprim 800-160 MG per  tablet   Commonly known as: BACTRIM DS,SEPTRA DS      traMADol 50 MG tablet   Commonly known as: ULTRAM      TAKE these medications         CYMBALTA 60 MG capsule   Generic drug: DULoxetine   Take 60 mg by mouth every morning.      esomeprazole 40 MG capsule   Commonly known as: NEXIUM   Take 1 capsule (40 mg total) by mouth 2 (two) times daily before a meal.      esomeprazole 40 MG packet   Commonly known as: NEXIUM   Take 40 mg by mouth 2 (two) times daily before a meal.      MICARDIS HCT 80-12.5 MG per tablet   Generic drug: telmisartan-hydrochlorothiazide   Take 1 tablet by mouth every morning.      multivitamin with minerals Tabs   Take 1 tablet by mouth daily.      ondansetron 4 MG disintegrating tablet   Commonly known as: ZOFRAN-ODT   Take 1 tablet (4 mg total) by mouth every 6 (six) hours as needed for nausea.      oxyCODONE-acetaminophen 5-325 MG/5ML solution   Commonly known as: ROXICET   Take 5-10 mLs by mouth every 4 (four) hours as needed. Pain.      promethazine 25 MG tablet   Commonly known as: PHENERGAN   Take 1 tablet (25 mg total) by mouth every 6 (  six) hours as needed for nausea.      promethazine 25 MG suppository   Commonly known as: PHENERGAN   Place 1 suppository (25 mg total) rectally every 6 (six) hours as needed for nausea.      sucralfate 1 GM/10ML suspension   Commonly known as: CARAFATE   Take 10 mLs (1 g total) by mouth 4 (four) times daily -  with meals and at bedtime.      VITAMIN-B COMPLEX PO   Take by mouth daily.         SignedLodema Pilot DAVID 02/14/2012, 1:35 PM

## 2012-02-14 NOTE — Progress Notes (Signed)
Patient discharged via wheelchair. IV removed by NT + 3. Spoke to Dr. Biagio Quint states patient can choose which nexium powder or capsule she wants but needs to start taking bid, informed md patient given dose of IV zofran at 1200 for some continued nausea. Patient states understanding of discharge instructions. Multiple Rx given

## 2012-02-14 NOTE — Progress Notes (Signed)
3 Days Post-Op  Subjective: Continues to feel better.  Taking solids no vomiting. Feels ready to go home  Objective: Vital signs in last 24 hours: Temp:  [97.8 F (36.6 C)-98.8 F (37.1 C)] 98.6 F (37 C) (10/08 0607) Pulse Rate:  [66-76] 66  (10/08 0607) Resp:  [18-20] 18  (10/08 0607) BP: (122-164)/(76-78) 148/76 mmHg (10/08 0607) SpO2:  [97 %-100 %] 99 % (10/08 0607) Last BM Date: 02/13/12  Intake/Output from previous day: 10/07 0701 - 10/08 0700 In: 4000 [P.O.:1000; I.V.:3000] Out: 1951 [Urine:1950; Stool:1] Intake/Output this shift: Total I/O In: 1620 [P.O.:120; I.V.:1500] Out: 800 [Urine:800]  General appearance: alert, cooperative and no distress Resp: clear to auscultation bilaterally Cardio: normal rate, regular GI: soft, non-tender; bowel sounds normal; no masses,  no organomegaly  Lab Results:  No results found for this basename: WBC:2,HGB:2,HCT:2,PLT:2 in the last 72 hours BMET No results found for this basename: NA:2,K:2,CL:2,CO2:2,GLUCOSE:2,BUN:2,CREATININE:2,CALCIUM:2 in the last 72 hours PT/INR No results found for this basename: LABPROT:2,INR:2 in the last 72 hours ABG No results found for this basename: PHART:2,PCO2:2,PO2:2,HCO3:2 in the last 72 hours  Studies/Results: No results found.  Anti-infectives: Anti-infectives     Start     Dose/Rate Route Frequency Ordered Stop   02/10/12 1100   ciprofloxacin (CIPRO) IVPB 400 mg        400 mg 200 mL/hr over 60 Minutes Intravenous Every 12 hours 02/10/12 1023 02/12/12 2306          Assessment/Plan: s/p Procedure(s) (LRB) with comments: ESOPHAGOGASTRODUODENOSCOPY (EGD) (N/A) seems to be improving.  nausea improved.  she should be ready for discharge today with BID PPI. no smoking.  LOS: 4 days    Carolyn Morrison 02/14/2012

## 2012-02-15 LAB — VITAMIN B1: Vitamin B1 (Thiamine): <7 nmol/L — ABNORMAL LOW (ref 8–30)

## 2012-02-21 ENCOUNTER — Telehealth (INDEPENDENT_AMBULATORY_CARE_PROVIDER_SITE_OTHER): Payer: Self-pay | Admitting: General Surgery

## 2012-02-21 ENCOUNTER — Telehealth (INDEPENDENT_AMBULATORY_CARE_PROVIDER_SITE_OTHER): Payer: Self-pay

## 2012-02-21 NOTE — Telephone Encounter (Signed)
Follow up call to patient regarding her Vitamin B1 lab results being deficient.  Patient reports she just left work and will pick up Vitamin B Complex this evening.  Patient reports she spoke to Dr. Biagio Quint earlier today and is aware of the importance of Vitamin B1 Supplementation that has at least 50-100mg  of Thiamine (B1).  Patient aware that if not corrected it can cause permanent brain damage.  Patient understands and fully aware.

## 2012-02-21 NOTE — Telephone Encounter (Signed)
I called Carolyn Morrison and discussed her B1 lab result with her.  She is asymptomatic and I explained that she should start B1 supplementation with a B vitamin supplement of at least 50-100mg  /day.  I have also placed a call to the nutritionist to alert her of this as well so we can work to correct this.  We will see her back in about 2 weeks to repeat the lab.  She expressed understanding of the importance of the supplement and understands the consequence of possible permanent neurologic damage if not corrected.

## 2012-02-28 ENCOUNTER — Encounter: Payer: Self-pay | Admitting: *Deleted

## 2012-02-28 ENCOUNTER — Encounter: Payer: 59 | Attending: General Surgery | Admitting: *Deleted

## 2012-02-28 DIAGNOSIS — Z01818 Encounter for other preprocedural examination: Secondary | ICD-10-CM | POA: Insufficient documentation

## 2012-02-28 DIAGNOSIS — Z713 Dietary counseling and surveillance: Secondary | ICD-10-CM | POA: Insufficient documentation

## 2012-02-28 NOTE — Patient Instructions (Addendum)
Goals:  Follow Phase 3B: High Protein + Non-Starchy Vegetables  Eat 3-6 small meals/snacks, every 3-5 hrs  Increase lean protein foods to meet 60-80g goal - Try protein shakes for now  Increase fluid intake to 64oz +  Continue to avoid drinking 15 minutes before, during and 30 minutes after eating  Aim for >30 min of physical activity daily as able  Continue B1 (Thiamine) supplementation until advised to discontinue by MD.

## 2012-02-28 NOTE — Progress Notes (Addendum)
Follow-up visit:  8 Weeks Post-Operative RYGB Surgery  Medical Nutrition Therapy:  Appt start time: 0930  End time: 1000.  Primary concerns today: Post-operative Bariatric Surgery Nutrition Management. Carolyn ("Ma-tweeko") returns today for 2 mo f/u. Has lost 31 lbs of FAT MASS in last 6 weeks. Recently hospitalized for intractable nausea. Thiamin deficiency (<7 nmol/L on 02/10/12) and 2 ulcers discovered. Reports taking 100 mg of thiamin po daily. Feeling much better, though c/o malaise and fatigue - likely d/t decreased energy intake. Reports severe pain in hip d/t MD discontinuing Celebrex, so not exercising at this time.   Surgery date: 01/03/12 Surgery type: RYGB Start weight at [facility]: 382.5 lbs  Weight today: 340.0 lbs Weight change: 18.5 lbs Total weight lost: 42.5 lbs BMI: 54.9 kg/m^2  TANITA  BODY COMP RESULTS  01/17/12 02/28/12   %Fat 56.6% 50.6%   Fat Mass (lbs) 203.0 172.0   Fat Free Mass (lbs) 155.5 168.0   Total Body Water (lbs) 114.0 123.0   24-hr recall: B (AM): 1/3 c Special K w/ strawberries and 1/4 c 2% milk Snk (AM): None  L (PM):  1 oz steak (leftovers), green beans (1-2 ea) Snk (PM): None  D (PM): 1 oz steak, few "licks" of mashed potatoes, small (1 oz) yeast roll Snk (PM): None  Fluid intake: 16-32 oz water, coffee sometimes (8 oz), 8 oz 2% milk = 20-40 oz - HIGHLY DECREASED FROM LAST VISIT Estimated total protein intake: 20-30 g - HIGHLY DECREASED FROM LAST VISIT  Medications:  Not taking Micardis; likely cause of 9 lbs of fluid retention today. Reports starting thiamine (100 mg) after hospital stay. No longer taking phenergan or zofran Supplementation: Reports taking as directed. Gave Freedavite samples to try.  Using straws: Occasionally Drinking while eating:  Sips when mouth is dry Hair loss: None Carbonated beverages: Sip of Pepsi every now and then N/V/D/C: None now; Previously severe nausea  Dumping syndrome: None  Recent physical activity:  None  d/t pain in hip.  Progress Towards Goal(s):  In progress.  Handouts given during visit include:  Phase 3B: High Protein + Non-Starchy Vegetables (ONLY if continue tolerating protein)   Nutritional Diagnosis:  NI-5.7.1 Inadequate protein intake related to nausea and decreased appetite s/p recent RYGB surgery as evidenced by patient consuming <50% of post-op protein needs.     Intervention:  Nutrition education/diet advancement.  Monitoring/Evaluation:  Dietary intake, exercise, and body weight. Follow up in 1 months for 3 month post-op visit.

## 2012-03-22 ENCOUNTER — Other Ambulatory Visit (INDEPENDENT_AMBULATORY_CARE_PROVIDER_SITE_OTHER): Payer: Self-pay

## 2012-03-22 DIAGNOSIS — R110 Nausea: Secondary | ICD-10-CM

## 2012-03-22 MED ORDER — SUCRALFATE 1 GM/10ML PO SUSP: 1.0000 g | Freq: Three times a day (TID) | ORAL | Status: DC

## 2012-03-22 NOTE — Telephone Encounter (Signed)
Refill order called into [facility], [place] [phone].  Carafate 1GM/10 ML SUSP, take 10 milliliters by mouth four times a day with meals and at bedtime, plus 1 refrill per written order by Dr. Biagio Quint.

## 2012-03-27 ENCOUNTER — Ambulatory Visit: Payer: 59 | Admitting: *Deleted

## 2012-03-28 ENCOUNTER — Encounter: Payer: 59 | Attending: General Surgery | Admitting: *Deleted

## 2012-03-28 ENCOUNTER — Encounter: Payer: Self-pay | Admitting: *Deleted

## 2012-03-28 ENCOUNTER — Ambulatory Visit (INDEPENDENT_AMBULATORY_CARE_PROVIDER_SITE_OTHER): Payer: 59 | Admitting: General Surgery

## 2012-03-28 ENCOUNTER — Encounter (INDEPENDENT_AMBULATORY_CARE_PROVIDER_SITE_OTHER): Payer: Self-pay | Admitting: General Surgery

## 2012-03-28 VITALS — BP 118/70 | HR 102 | Temp 96.2°F | Resp 18 | Ht 66.0 in | Wt 319.4 lb

## 2012-03-28 DIAGNOSIS — Z713 Dietary counseling and surveillance: Secondary | ICD-10-CM | POA: Insufficient documentation

## 2012-03-28 DIAGNOSIS — Z01818 Encounter for other preprocedural examination: Secondary | ICD-10-CM | POA: Insufficient documentation

## 2012-03-28 DIAGNOSIS — K912 Postsurgical malabsorption, not elsewhere classified: Secondary | ICD-10-CM

## 2012-03-28 NOTE — Patient Instructions (Addendum)
Goals:  Follow Phase 3B: High Protein + Non-Starchy Vegetables  Eat 3-6 small meals/snacks, every 3-5 hrs  Continue to avoid drinking 15 minutes before, during and 30 minutes after eating  Aim for >30 min of physical activity daily as able  Continue B1 (Thiamine) supplementation until advised to discontinue by MD.   Start calcium supplementation - 1500 mg/day (Citracal Petites)  **Check into Dr. Lajoyce Corners for hip replacement - [facility]

## 2012-03-28 NOTE — Progress Notes (Signed)
Follow-up visit:  12 Weeks Post-Operative RYGB Surgery  Medical Nutrition Therapy:  Appt start time: 1215  End time: 1250.  Primary concerns today: Post-operative Bariatric Surgery Nutrition Management. Linday returns today for 3 mo f/u with additional weight loss of 19.5 lbs in last 4 weeks. Feeling much better since last visit, though reports severe pain in hip today. Continues taking thiamin and other supplements as directed.  Fluids and protein intake have returned to WNL.   Surgery date: 01/03/12 Surgery type: RYGB Start weight at [facility]: 382.5 lbs (08/2011)  Weight today: 320.5 lbs Weight change: 19.5 lbs Total weight lost: 62.0 lbs BMI: 51.7 kg/m^2  TANITA  BODY COMP RESULTS  01/17/12 02/28/12 03/28/12   Fat Mass (lbs) 203.0 172.0 166.0   Fat Free Mass (lbs) 155.5 168.0 154.5   Total Body Water (lbs) 114.0 123.0 113.0   24-hr recall: B (AM): 1/3 c Special K w/ strawberries OR 1/2 cup cinnamon Life and 1/4 c 2% milk Snk (AM): None OR walnuts (1 oz) L (PM):  2-3 oz chicken (leftovers), green beans (1-2 ea) Snk (PM): None  D (PM): 2-3 oz lean protein and vegetables Snk (PM): low calorie, low fat Eddy's vanilla (1/4c)  Fluid intake:  45 oz water, coffee (12 oz), 8 oz 2% milk, unsweet tea (32 oz) = 95 oz  Estimated total protein intake:    Medications:  No changes reported Supplementation: Reports taking as directed.   Using straws: Occasionally Drinking while eating:  Sips when mouth is dry Hair loss: None Carbonated beverages: Sip of Pepsi every now and then N/V/D/C:  No Dumping syndrome: Reports 1 episode of dumping; doing fine now.  Recent physical activity:  None d/t pain in hip.  Progress Towards Goal(s):  In progress.  Nutritional Diagnosis:  NI-5.7.1 Inadequate protein intake related to nausea and decreased appetite s/p recent RYGB surgery as evidenced by patient consuming <50% of post-op protein needs.     Intervention:  Nutrition education/diet  advancement.  Monitoring/Evaluation:  Dietary intake, exercise, and body weight. Follow up in 3 months for 6 month post-op visit

## 2012-03-28 NOTE — Progress Notes (Signed)
Subjective:     Patient ID: Carolyn Morrison, female   DOB: 10-14-1960, 51 y.o.   MRN: [id number]  HPI This patient follows up almost 3 months status post laparoscopic Roux-en-Y gastric bypass. She has lost about 70 pounds since her surgery and about 100 pounds sinceher largest weight. She has lost about 25 pounds over the last month. She had a marginal ulcer initially which complicated her postoperative course but once this was diagnosed and she was treated with Protonix twice a day and Carafate she has been feeling well. She has no food intolerance or nausea or vomiting and she denies abdominal pain. She is occasionally taking her protein shakes and she is trying to get about 50-60 g of protein per day but again she is not really supplementing with her shakes. She is not exercising.she has no complaints of any memory loss, peripheral neuropathy, numbness or tingling or weakness in her extremities  Review of Systems     Objective:   Physical Exam In no distress and nontoxic-appearing sitting comfortably in a chair.her abdomen is soft and nontender on exam her incisions are healing well without sign of infection. There is no evidence of any hernia. She has no evidence of any neurologic deficits    Assessment:     Status post laparoscopic Roux-en-Y gastric bypass for morbid obesity She is doing very well now that her marginal ulcers have seemingly resolved. She remains on Protonix twice a day and Carafate and has no complaints of nausea or vomiting or abdominal pain or food intolerance. She is not really taking her protein supplements and not exercising and I think that this will eventually hinder her weight loss and we discussed this but she is a tough situation with her severe hip problems. She is almost down to the weight where her orthopedic surgeon would consider hip replacement that she is looking forward to this. I recommended that she try stationary bicycle if this is okay with her orthopedic  surgeon so we can try to improve her weight loss. She has no evidence of any neurologic deficits or other vitamin deficiencies. She says that she continue taking her B1 supplements.    Plan:     We will go ahead and check some nutrition labs today and I will see her back in 3 months.

## 2012-05-16 ENCOUNTER — Telehealth (INDEPENDENT_AMBULATORY_CARE_PROVIDER_SITE_OTHER): Payer: Self-pay | Admitting: General Surgery

## 2012-05-16 NOTE — Telephone Encounter (Signed)
I called the patient to see how she was doing.  She is at [facility] s/p hip replacement.  She says that she is still taking her vitamins and B1.  I recommended that she call my office to set up repeat labs and 6 month follow up visit.

## 2012-12-21 ENCOUNTER — Other Ambulatory Visit (INDEPENDENT_AMBULATORY_CARE_PROVIDER_SITE_OTHER): Payer: Self-pay

## 2012-12-21 ENCOUNTER — Telehealth (INDEPENDENT_AMBULATORY_CARE_PROVIDER_SITE_OTHER): Payer: Self-pay

## 2012-12-21 DIAGNOSIS — Z09 Encounter for follow-up examination after completed treatment for conditions other than malignant neoplasm: Secondary | ICD-10-CM

## 2012-12-21 DIAGNOSIS — K912 Postsurgical malabsorption, not elsewhere classified: Secondary | ICD-10-CM

## 2012-12-21 DIAGNOSIS — Z9884 Bariatric surgery status: Secondary | ICD-10-CM

## 2012-12-21 NOTE — Telephone Encounter (Signed)
Called and left message for patient re: Please make patient aware of  6 month follow-up appointment scheduled for 01/02/13 @ 11:45 am w/Dr. Biagio Quint also, lab orders have been placed and patient need's to go to [facility] anytime after 12/26/12 for 6 month labs.

## 2013-01-02 ENCOUNTER — Ambulatory Visit (INDEPENDENT_AMBULATORY_CARE_PROVIDER_SITE_OTHER): Payer: 59 | Admitting: General Surgery

## 2013-01-02 NOTE — Telephone Encounter (Signed)
Patient return call-  Patient states that she will need to cancel her appointment and labs due to not having insurance.  Patient will call our office after she has health insurance.  Dr. Biagio Quint notified

## 2013-08-23 ENCOUNTER — Encounter (INDEPENDENT_AMBULATORY_CARE_PROVIDER_SITE_OTHER): Payer: Self-pay | Admitting: Surgery

## 2013-09-11 ENCOUNTER — Ambulatory Visit (INDEPENDENT_AMBULATORY_CARE_PROVIDER_SITE_OTHER): Payer: 59 | Admitting: Surgery

## 2013-09-27 ENCOUNTER — Ambulatory Visit (INDEPENDENT_AMBULATORY_CARE_PROVIDER_SITE_OTHER): Payer: 59 | Admitting: General Surgery

## 2013-09-27 ENCOUNTER — Encounter (INDEPENDENT_AMBULATORY_CARE_PROVIDER_SITE_OTHER): Payer: Self-pay | Admitting: General Surgery

## 2013-09-27 VITALS — BP 116/80 | HR 72 | Temp 99.2°F | Resp 14 | Ht 66.0 in | Wt 246.6 lb

## 2013-09-27 DIAGNOSIS — D171 Benign lipomatous neoplasm of skin and subcutaneous tissue of trunk: Secondary | ICD-10-CM | POA: Insufficient documentation

## 2013-09-27 DIAGNOSIS — D1779 Benign lipomatous neoplasm of other sites: Secondary | ICD-10-CM

## 2013-09-27 NOTE — Progress Notes (Signed)
Patient ID: Carolyn Morrison, female   DOB: 1960-09-28, 53 y.o.   MRN: [id number]  Chief Complaint  Patient presents with  . Lipoma    new pt    HPI Carolyn Morrison is a 53 y.o. female.  She is referred by Dr. Benjie Karvonen  for a surgical consultation regarding a soft tissue mass of the left upper back.  This patient has a history of morbid obesity. She had a Roux-en-Y gastric bypass by Dr. Lilyan Punt in August of 2013. She has lost 170 pounds but is still morbidly obese. Last endoscopy by Dr. Carlean Purl was 2013 showing some ulceration at the anastomosis.  She noticed a lump on her left upper back about one year ago. She states it has not changed in size. She states that there is no pain and it does not bother her in any way. She states Dr. Benjie Karvonen wanted our opinion.  HPI  Past Medical History  Diagnosis Date  . GERD (gastroesophageal reflux disease)   . Arthritis   . Hypertension   . Hyperlipidemia     Past Surgical History  Procedure Laterality Date  . Cesarean section  1992  . Cholecystectomy  2001  . Facial reconstruction surgery  1978  . Appendectomy  1976  . Wisdom tooth extraction    . Gastric roux-en-y  01/03/2012    Procedure: LAPAROSCOPIC ROUX-EN-Y GASTRIC BYPASS WITH UPPER ENDOSCOPY;  Surgeon: Madilyn Hook, DO;  Location: [facility];  Service: General;  Laterality: N/A;  . Esophagogastroduodenoscopy  02/11/2012    Procedure: ESOPHAGOGASTRODUODENOSCOPY (EGD);  Surgeon: Gatha Mayer, MD;  Location: [facility];  Service: Endoscopy;  Laterality: N/A;  . Total hip arthroplasty      Family History  Problem Relation Age of Onset  . Diabetes Maternal Aunt     Social History History  Substance Use Topics  . Smoking status: Former Smoker -- 0.25 packs/day    Quit date: 10/13/2011  . Smokeless tobacco: Never Used  . Alcohol Use: Yes     Comment: occasional    No Known Allergies  Current Outpatient Prescriptions  Medication Sig Dispense Refill  . B Complex Vitamins (VITAMIN-B  COMPLEX PO) Place 500 mcg under the tongue daily.       Marland Kitchen esomeprazole (NEXIUM) 40 MG capsule Take 1 capsule (40 mg total) by mouth 2 (two) times daily before a meal.  60 capsule  6  . Multiple Vitamin (MULTIVITAMIN WITH MINERALS) TABS Take 1 tablet by mouth 2 (two) times daily.        No current facility-administered medications for this visit.    Review of Systems Review of Systems  Constitutional: Positive for unexpected weight change. Negative for fever and chills.  HENT: Negative for congestion, hearing loss, sore throat, trouble swallowing and voice change.   Eyes: Negative for visual disturbance.  Respiratory: Negative for cough and wheezing.   Cardiovascular: Negative for chest pain, palpitations and leg swelling.  Gastrointestinal: Negative for nausea, vomiting, abdominal pain, diarrhea, constipation, blood in stool, abdominal distention and anal bleeding.  Genitourinary: Negative for hematuria, vaginal bleeding and difficulty urinating.  Musculoskeletal: Positive for arthralgias and myalgias.  Skin: Negative for rash and wound.  Neurological: Negative for seizures, syncope and headaches.  Hematological: Negative for adenopathy. Does not bruise/bleed easily.  Psychiatric/Behavioral: Negative for confusion.    Blood pressure 116/80, pulse 72, temperature 99.2 F (37.3 C), temperature source Oral, resp. rate 14, height 5\' 6"  (1.676 m), weight 246 lb 9.6 oz (111.857 kg).  Physical Exam Physical Exam  Constitutional: She is oriented to person, place, and time. She appears well-developed and well-nourished. No distress.  BMI 39.8  HENT:  Head: Normocephalic and atraumatic.  Nose: Nose normal.  Mouth/Throat: No oropharyngeal exudate.  Eyes: Conjunctivae and EOM are normal. Pupils are equal, round, and reactive to light. Left eye exhibits no discharge. No scleral icterus.  Neck: Neck supple. No JVD present. No tracheal deviation present. No thyromegaly present.  Cardiovascular:  Normal rate, regular rhythm, normal heart sounds and intact distal pulses.   No murmur heard. Pulmonary/Chest: Effort normal and breath sounds normal. No respiratory distress. She has no wheezes. She has no rales. She exhibits no tenderness.  In the left upper back, overlying the scapula is a 7-8 cm soft tissue mass. This appears to be in the deep subcutaneous tissue. Does not move when she raises her arm so probably not under the trapezius muscle. Classic texture and characteristics of a benign lipoma. No axillary adenopathy.  Abdominal: She exhibits mass.  Musculoskeletal: She exhibits no edema and no tenderness.  Lymphadenopathy:    She has no cervical adenopathy.  Neurological: She is alert and oriented to person, place, and time. She exhibits normal muscle tone. Coordination normal.  Skin: Skin is warm. No rash noted. She is not diaphoretic. No erythema. No pallor.  Psychiatric: She has a normal mood and affect. Her behavior is normal. Judgment and thought content normal.    Data Reviewed Dr. Gardiner Coins office notes  Assessment    7 cm lipoma, left upper back, asymptomatic  Morbid obesity  History of Roux-en-Y gastric bypass  GERD, anastomotic ulcer     Plan    I discussed the physical findings and differential diagnosis with her. I told her that since she was asymptomatic and almost certainly benign that excision of this is optional, not mandatory. She does not want this excised at this time  I've asked her to return to see me if this area enlarged or became painful.        Adin Hector 09/27/2013, 9:23 AM

## 2013-09-27 NOTE — Patient Instructions (Signed)
Your physical exam is consistent with a 7 cm diameter benign lipoma of the left upper back. This appears to overlie the left scapula and is probably confined to the subcutaneous tissue.  Since you are asymptomatic and this does not appear to be growing, there is no obvious indication to remove this.  You have stated that he do not want to have this removed, and that is very reasonable.  Return to see Dr. Dalbert Batman if the lipoma enlarges or becomes painful.

## 2016-02-12 ENCOUNTER — Encounter (HOSPITAL_COMMUNITY): Payer: Self-pay

## 2017-08-02 ENCOUNTER — Encounter (HOSPITAL_COMMUNITY): Payer: Self-pay

## 2019-01-16 ENCOUNTER — Other Ambulatory Visit: Payer: Self-pay | Admitting: Obstetrics and Gynecology

## 2019-01-16 DIAGNOSIS — N83292 Other ovarian cyst, left side: Secondary | ICD-10-CM

## 2019-01-16 DIAGNOSIS — N83291 Other ovarian cyst, right side: Secondary | ICD-10-CM

## 2019-01-21 ENCOUNTER — Ambulatory Visit
Admission: RE | Admit: 2019-01-21 | Discharge: 2019-01-21 | Disposition: A | Discharge status: Home / Self Care | Payer: 59 | Source: Ambulatory Visit | Attending: Obstetrics and Gynecology | Admitting: Obstetrics and Gynecology

## 2019-01-21 ENCOUNTER — Other Ambulatory Visit: Payer: Self-pay

## 2019-01-21 DIAGNOSIS — N83291 Other ovarian cyst, right side: Secondary | ICD-10-CM

## 2019-01-21 DIAGNOSIS — N83292 Other ovarian cyst, left side: Secondary | ICD-10-CM

## 2019-01-21 MED ORDER — IOPAMIDOL (ISOVUE-300) INJECTION 61%
125.0000 mL | Freq: Once | INTRAVENOUS | Status: AC | PRN
  Administered 2019-01-21: 125 mL via INTRAVENOUS
